# Patient Record
Sex: Male | Born: 1980 | Race: White | Hispanic: No | Marital: Married | Smoking: Never smoker
Health system: Southern US, Community
[De-identification: ages and names within clinical notes are randomized; demographics above are authoritative.]

## PROBLEM LIST (undated history)

## (undated) DIAGNOSIS — Z789 Other specified health status: Secondary | ICD-10-CM

## (undated) HISTORY — PX: NO PAST SURGERIES: SHX2092

---

## 2011-10-23 ENCOUNTER — Ambulatory Visit (INDEPENDENT_AMBULATORY_CARE_PROVIDER_SITE_OTHER): Payer: 59 | Admitting: Family Medicine

## 2011-10-23 VITALS — BP 143/80 | HR 42 | Temp 97.6°F | Resp 16 | Ht 79.5 in | Wt 186.0 lb

## 2011-10-23 DIAGNOSIS — H9209 Otalgia, unspecified ear: Secondary | ICD-10-CM

## 2011-10-23 DIAGNOSIS — L089 Local infection of the skin and subcutaneous tissue, unspecified: Secondary | ICD-10-CM

## 2011-10-23 DIAGNOSIS — L723 Sebaceous cyst: Secondary | ICD-10-CM

## 2011-10-23 MED ORDER — CEPHALEXIN 500 MG PO CAPS: 1000.0000 mg | ORAL_CAPSULE | Freq: Two times a day (BID) | ORAL | Status: AC

## 2011-10-23 NOTE — Progress Notes (Signed)
  Patient Name: Eric Mack Date of Birth: Jul 09, 1981 Medical Record Number: [id number] Gender: male Date of Encounter: 10/23/2011  History of Present Illness:  Eric Mack is a 31 y.o. very pleasant male patient who presents with the following:  About a week ago noted a "solid lump" at the base of his earlobe.  Came up suddenly over the course of a day- the size of a small grape, very hard.  It was not sore then, but yesterday started to hurt and turned red.  No otalygia, no hearing loss or ear drainage.   Otherwise he feel fine.  No fever or other symptoms.  Generally healthy and no other issues in the past.  Never had this before.   Of note the patient is quite athletic/ runs a lot and states that a low pulse is normal for him, he has been told that his pulse was low in the past.   There is no problem list on file for this patient.  History reviewed. No pertinent past medical history. History reviewed. No pertinent past surgical history. History  Substance Use Topics  . Smoking status: Never Smoker   . Smokeless tobacco: Never Used  . Alcohol Use: Not on file   History reviewed. No pertinent family history. Allergies no known allergies  Medication list has been reviewed and updated.  Review of Systems: As per HPI, otherwise negative  Physical Examination: Filed Vitals:   10/23/11 1835  BP: 143/80  Pulse: 42  Temp: 97.6 F (36.4 C)  TempSrc: Oral  Resp: 16  Height: 6' 7.5" (2.019 m)  Weight: 186 lb (84.369 kg)    Body mass index is 20.69 kg/(m^2).   GEN: WDWN, NAD, Non-toxic, Alert & Oriented x 3 HEENT: Atraumatic, Normocephalic.  CV: RRR, slightly bradycardic.  Pulm: CTA Bilaterally Ears and Nose: No external deformity.  Below right ear lobe (at connection between lobe and angle of jaw) there is a slightly tender, fluctuant, well circumscribed swelling typical of a cyst or abscess.  TM wnl bilaterally, oropharynx wnl.  No LAD EXTR: No  clubbing/cyanosis/edema NEURO: Normal gait.  PSYCH: Normally interactive. Conversant. Not depressed or anxious appearing.  Calm demeanor.   Procedure:  Cleaned area with alcohol.  Anesthesia with a small wheal of lidocaine 1%.  I and D with an 18 gauge needle- small amount of material expressed but needed to expand would slightly with an 11 blade.  Large amount of sebaceous material expressed.  Took a would culture but did seem to be just sebaceous material.  Expressed all sebaceous material.  Did not pack due to wound being so small.   Assessment and Plan: 1. Infected sebaceous cyst of skin  cephALEXin (KEFLEX) 500 MG capsule, Wound culture  infected sebaceous cyst s/p I&D.  Did not pack due to very small wound and likely sebaceous nature of cyst.  Will treat with Keflex as above.  Asked patient to apply warm compresses twice a day starting tomorrow, and keep covered with a band- aid until no longer oozing.  If any sign of worsening or if area returns please call or come back in.  Sooner if worse.

## 2011-10-26 ENCOUNTER — Encounter: Payer: Self-pay | Admitting: Family Medicine

## 2011-10-26 LAB — WOUND CULTURE
Gram Stain: NONE SEEN
Gram Stain: NONE SEEN

## 2012-02-26 ENCOUNTER — Ambulatory Visit: Payer: 59

## 2012-02-26 ENCOUNTER — Ambulatory Visit: Payer: 59 | Admitting: Emergency Medicine

## 2012-02-26 VITALS — BP 134/85 | HR 56 | Temp 98.3°F | Resp 14 | Ht 72.5 in | Wt 183.0 lb

## 2012-02-26 DIAGNOSIS — R05 Cough: Secondary | ICD-10-CM

## 2012-02-26 DIAGNOSIS — H109 Unspecified conjunctivitis: Secondary | ICD-10-CM

## 2012-02-26 DIAGNOSIS — J189 Pneumonia, unspecified organism: Secondary | ICD-10-CM

## 2012-02-26 LAB — POCT CBC
Granulocyte percent: 71.9 %G (ref 37–80)
HCT, POC: 47.7 % (ref 43.5–53.7)
Hemoglobin: 15.4 g/dL (ref 14.1–18.1)
POC Granulocyte: 7.4 — AB (ref 2–6.9)

## 2012-02-26 MED ORDER — OFLOXACIN 0.3 % OP SOLN: OPHTHALMIC | Status: DC

## 2012-02-26 MED ORDER — AZITHROMYCIN 250 MG PO TABS: ORAL_TABLET | ORAL | Status: AC

## 2012-02-26 NOTE — Progress Notes (Signed)
  Subjective:    Patient ID: Eric Mack, male    DOB: 1981-05-30, 31 y.o.   MRN: [id number]  HPI patient enters with onset on Saturday evening of fever chills myalgias. This was followed by head and chest congestion. He has had a persistent cough recently the cough has changed and he has develop any discolored phlegm his fever has subsided however yesterday started having crusting and discomfort involving the right. He's had no nausea vomiting or other systemic symptoms    Review of Systems  Constitutional: Positive for fever and fatigue.  HENT: Positive for congestion and rhinorrhea.   Eyes: Positive for discharge.       He has a crusty discharge around the right. eye  Respiratory: Positive for cough.   Cardiovascular: Negative for chest pain, palpitations and leg swelling.       Objective:   Physical Exam  Constitutional: He appears well-developed and well-nourished.  HENT:  Head: Normocephalic.       Ears are wax occluded  Pulmonary/Chest: Effort normal and breath sounds normal. No respiratory distress. He has no wheezes. He has no rales.  Abdominal: Soft. Bowel sounds are normal. He exhibits no distension. There is no tenderness. There is no rebound.   Results for orders placed in visit on 02/26/12  POCT CBC      Component Value Range   WBC 10.3 (*) 4.6 - 10.2 K/uL   Lymph, poc 2.3  0.6 - 3.4   POC LYMPH PERCENT 22.2  10 - 50 %L   MID (cbc) 0.6  0 - 0.9   POC MID % 5.9  0 - 12 %M   POC Granulocyte 7.4 (*) 2 - 6.9   Granulocyte percent 71.9  37 - 80 %G   RBC 4.97  4.69 - 6.13 M/uL   Hemoglobin 15.4  14.1 - 18.1 g/dL   HCT, POC 41.3  24.4 - 53.7 %   MCV 95.9  80 - 97 fL   MCH, POC 31.0  27 - 31.2 pg   MCHC 32.3  31.8 - 35.4 g/dL   RDW, POC 01.0     Platelet Count, POC 248  142 - 424 K/uL   MPV 10.5  0 - 99.8 fL   [facility] reading (PRIMARY) by  Dr.Mykale Gandolfo for increased markings in the right middle lobe .       Assessment & Plan:

## 2012-02-26 NOTE — Patient Instructions (Addendum)
Take medications as prescribed. Recheck 48-72 hours if not improvedConjunctivitis Conjunctivitis is commonly called "pink eye." Conjunctivitis can be caused by bacterial or viral infection, allergies, or injuries. There is usually redness of the lining of the eye, itching, discomfort, and sometimes discharge. There may be deposits of matter along the eyelids. A viral infection usually causes a watery discharge, while a bacterial infection causes a yellowish, thick discharge. Pink eye is very contagious and spreads by direct contact. You may be given antibiotic eyedrops as part of your treatment. Before using your eye medicine, remove all drainage from the eye by washing gently with warm water and cotton balls. Continue to use the medication until you have awakened 2 mornings in a row without discharge from the eye. Do not rub your eye. This increases the irritation and helps spread infection. Use separate towels from other household members. Wash your hands with soap and water before and after touching your eyes. Use cold compresses to reduce pain and sunglasses to relieve irritation from light. Do not wear contact lenses or wear eye makeup until the infection is gone. SEEK MEDICAL CARE IF:   Your symptoms are not better after 3 days of treatment.   You have increased pain or trouble seeing.   The outer eyelids become very red or swollen.  Document Released: 10/01/2004 Document Revised: 08/13/2011 Document Reviewed: 08/24/2005 [facility] Patient Information 2012 [place], [place].Pneumonia, Adult Pneumonia is an infection of the lungs.  CAUSES Pneumonia may be caused by bacteria or a virus. Usually, these infections are caused by breathing infectious particles into the lungs (respiratory tract). SYMPTOMS   Cough.   Fever.   Chest pain.   Increased rate of breathing.   Wheezing.   Mucus production.  DIAGNOSIS  If you have the common symptoms of pneumonia, your caregiver will typically confirm  the diagnosis with a chest X-ray. The X-ray will show an abnormality in the lung (pulmonary infiltrate) if you have pneumonia. Other tests of your blood, urine, or sputum may be done to find the specific cause of your pneumonia. Your caregiver may also do tests (blood gases or pulse oximetry) to see how well your lungs are working. TREATMENT  Some forms of pneumonia may be spread to other people when you cough or sneeze. You may be asked to wear a mask before and during your exam. Pneumonia that is caused by bacteria is treated with antibiotic medicine. Pneumonia that is caused by the influenza virus may be treated with an antiviral medicine. Most other viral infections must run their course. These infections will not respond to antibiotics.  PREVENTION A pneumococcal shot (vaccine) is available to prevent a common bacterial cause of pneumonia. This is usually suggested for:  People over 41 years old.   Patients on chemotherapy.   People with chronic lung problems, such as bronchitis or emphysema.   People with immune system problems.  If you are over 65 or have a high risk condition, you may receive the pneumococcal vaccine if you have not received it before. In some countries, a routine influenza vaccine is also recommended. This vaccine can help prevent some cases of pneumonia.You may be offered the influenza vaccine as part of your care. If you smoke, it is time to quit. You may receive instructions on how to stop smoking. Your caregiver can provide medicines and counseling to help you quit. HOME CARE INSTRUCTIONS   Cough suppressants may be used if you are losing too much rest. However, coughing protects you by  clearing your lungs. You should avoid using cough suppressants if you can.   Your caregiver may have prescribed medicine if he or she thinks your pneumonia is caused by a bacteria or influenza. Finish your medicine even if you start to feel better.   Your caregiver may also  prescribe an expectorant. This loosens the mucus to be coughed up.   Only take over-the-counter or prescription medicines for pain, discomfort, or fever as directed by your caregiver.   Do not smoke. Smoking is a common cause of bronchitis and can contribute to pneumonia. If you are a smoker and continue to smoke, your cough may last several weeks after your pneumonia has cleared.   A cold steam vaporizer or humidifier in your room or home may help loosen mucus.   Coughing is often worse at night. Sleeping in a semi-upright position in a recliner or using a couple pillows under your head will help with this.   Get rest as you feel it is needed. Your body will usually let you know when you need to rest.  SEEK IMMEDIATE MEDICAL CARE IF:   Your illness becomes worse. This is especially true if you are elderly or weakened from any other disease.   You cannot control your cough with suppressants and are losing sleep.   You begin coughing up blood.   You develop pain which is getting worse or is uncontrolled with medicines.   You have a fever.   Any of the symptoms which initially brought you in for treatment are getting worse rather than better.   You develop shortness of breath or chest pain.  MAKE SURE YOU:   Understand these instructions.   Will watch your condition.   Will get help right away if you are not doing well or get worse.  Document Released: 08/24/2005 Document Revised: 08/13/2011 Document Reviewed: 11/13/2010 [facility] Patient Information 2012 [place], [place].

## 2013-05-24 ENCOUNTER — Ambulatory Visit (INDEPENDENT_AMBULATORY_CARE_PROVIDER_SITE_OTHER): Payer: 59 | Admitting: Family Medicine

## 2013-05-24 ENCOUNTER — Ambulatory Visit: Payer: 59

## 2013-05-24 VITALS — BP 120/78 | HR 66 | Temp 98.6°F | Resp 18 | Ht 71.0 in | Wt 189.2 lb

## 2013-05-24 DIAGNOSIS — M542 Cervicalgia: Secondary | ICD-10-CM

## 2013-05-24 DIAGNOSIS — S139XXA Sprain of joints and ligaments of unspecified parts of neck, initial encounter: Secondary | ICD-10-CM

## 2013-05-24 MED ORDER — IBUPROFEN 800 MG PO TABS: 800.0000 mg | ORAL_TABLET | Freq: Three times a day (TID) | ORAL | Status: DC | PRN

## 2013-05-24 MED ORDER — CYCLOBENZAPRINE HCL 5 MG PO TABS: 5.0000 mg | ORAL_TABLET | Freq: Three times a day (TID) | ORAL | Status: DC | PRN

## 2013-05-24 MED ORDER — TRAMADOL HCL 50 MG PO TABS: 50.0000 mg | ORAL_TABLET | Freq: Four times a day (QID) | ORAL | Status: DC | PRN

## 2013-05-24 NOTE — Progress Notes (Signed)
[address]   [place], [place]  [address]   [id number]  Subjective:    Patient ID: Eric Mack, male    DOB: 24-Apr-1981, 32 y.o.   MRN: [id number]  Neck Pain  Associated symptoms include weakness. Pertinent negatives include no fever, headaches, numbness or photophobia.   This 32 y.o. male presents for evaluation of neck pain.  No injury; onset four days ago.  Awoke with neck pain.  With extension of neck, has severe neck pain.  Similar symptoms in May 2014, with weight lifting process.  Started reading in bed in prolonged flexion in May 2014.  Have restarted reading in bed with recurrent pain.  No previous evaluation.  Pain quite severe.  Shoots down arm with extension.  No n/t in arm; shooting pain in R arm intermittent.  Severity 8/10.  ASA six today unknown dose.  +pain interfering with sleep.  No weight lifting.  +ice to area; tried heat last night; no improvement with ice; heat helped last night.  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  PCP: none  Review of Systems  Constitutional: Negative for fever, chills, diaphoresis and fatigue.  HENT: Positive for neck pain.   Eyes: Negative for photophobia and visual disturbance.  Musculoskeletal: Positive for myalgias and back pain.  Neurological: Positive for weakness. Negative for numbness and headaches.    History reviewed. No pertinent past medical history.  History reviewed. No pertinent past surgical history.  Prior to Admission medications   Medication Sig Start Date End Date Taking? Authorizing Provider  ofloxacin (OCUFLOX) 0.3 % ophthalmic solution S1-2 drops in the right eye every 3-4 hours 02/26/12   Collene Gobble, MD    No Known Allergies  History   Social History  . Marital Status: Unknown    Spouse Name: N/A    Number of Children: N/A  . Years of Education: N/A   Occupational History  . Not on file.   Social History Main Topics  . Smoking status: Never  Smoker   . Smokeless tobacco: Never Used  . Alcohol Use: Yes  . Drug Use: No  . Sexual Activity: Not on file   Other Topics Concern  . Not on file   Social History Narrative  . No narrative on file    History reviewed. No pertinent family history.     Objective:   Physical Exam  Nursing note and vitals reviewed. Constitutional: He is oriented to person, place, and time. He appears well-developed and well-nourished. No distress.  HENT:  Head: Normocephalic and atraumatic.  Eyes: Conjunctivae and EOM are normal. Pupils are equal, round, and reactive to light.  Neck: Normal range of motion. Neck supple. No thyromegaly present.  Cardiovascular: Normal rate, regular rhythm and normal heart sounds.  Exam reveals no gallop and no friction rub.   No murmur heard. Pulmonary/Chest: Effort normal and breath sounds normal.  Musculoskeletal:       Right shoulder: Normal.       Left shoulder: Normal.       Cervical back: He exhibits decreased range of motion, tenderness, pain and spasm. He exhibits no bony tenderness and normal pulse.  Cervical spine:  TTP R paraspinal region; pain with extension, lateral bending, rotating side to side.  Full ROM BUE: motor 5/5 BUE.  Grip 5/5.  Lymphadenopathy:    He has no cervical adenopathy.  Neurological: He is alert and oriented to person, place, and time. He  has normal reflexes. No cranial nerve deficit. He exhibits normal muscle tone. Coordination normal.  Skin: Skin is warm and dry. No rash noted. He is not diaphoretic.  Psychiatric: He has a normal mood and affect. His behavior is normal. Judgment and thought content normal.   [facility] reading (PRIMARY) by  Dr. Katrinka Blazing. CERVICAL SPINE: NAD.      Assessment & Plan:  Neck pain - Plan: DG Cervical Spine Complete  Neck sprain and strain, initial encounter  1. Cervical neck pain/strain:  New.  Rx for Ibuprofen 800mg  tid, Flexeril 5mg  one po tid PRN, Tramadol 50mg  PRN pain.  Home exercises reviewed and  advised to perform daily; recommend heat to area for 15 minutes bid for two weeks. If no improvement in 10-14 days, call for ortho referral. Avoid heavy lifting or overhead work for two weeks.   Meds ordered this encounter  Medications  . ibuprofen (ADVIL,MOTRIN) 800 MG tablet    Sig: Take 1 tablet (800 mg total) by mouth every 8 (eight) hours as needed for pain.    Dispense:  30 tablet    Refill:  0  . cyclobenzaprine (FLEXERIL) 5 MG tablet    Sig: Take 1 tablet (5 mg total) by mouth 3 (three) times daily as needed for muscle spasms.    Dispense:  30 tablet    Refill:  1  . traMADol (ULTRAM) 50 MG tablet    Sig: Take 1-2 tablets (50-100 mg total) by mouth every 6 (six) hours as needed for pain.    Dispense:  40 tablet    Refill:  0

## 2013-10-29 ENCOUNTER — Ambulatory Visit: Payer: 59 | Admitting: Emergency Medicine

## 2013-10-29 VITALS — BP 110/70 | HR 60 | Temp 97.9°F | Resp 16 | Ht 71.0 in | Wt 192.0 lb

## 2013-10-29 DIAGNOSIS — H9201 Otalgia, right ear: Secondary | ICD-10-CM

## 2013-10-29 DIAGNOSIS — L723 Sebaceous cyst: Secondary | ICD-10-CM

## 2013-10-29 DIAGNOSIS — H9209 Otalgia, unspecified ear: Secondary | ICD-10-CM

## 2013-10-29 DIAGNOSIS — H938X9 Other specified disorders of ear, unspecified ear: Secondary | ICD-10-CM

## 2013-10-29 NOTE — Patient Instructions (Signed)
WOUND CARE Please return in 6-8 days to have your stitches/staples removed or sooner if you have concerns. Marland Kitchen. Keep area clean and dry for 24 hours. Do not remove bandage, if applied. . After 24 hours, remove bandage and wash wound gently with mild soap and warm water. Reapply a new bandage after cleaning wound, if directed. . Continue daily cleansing with soap and water until stitches/staples are removed. . Do not apply any ointments or creams to the wound while stitches/staples are in place, as this may cause delayed healing. . Notify the office if you experience any of the following signs of infection: Swelling, redness, pus drainage, streaking, fever >101.0 F . Notify the office if you experience excessive bleeding that does not stop after 15-20 minutes of constant, firm pressure.

## 2013-10-29 NOTE — Progress Notes (Signed)
  [facility] [address], [place] [address] [phone]  Date:  10/29/2013   Name:  Eric AmesMatthew L Mack   DOB:  [birth year]   MRN:  [id number]  PCP:  No PCP Per Patient    Chief Complaint: Cyst   History of Present Illness:  Eric Mack is a 33 y.o. 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  Previously drained a year ago.  Now having pain when uses telephone or wears a headphone at work.  No history of injury.  No drainage.  No improvement with over the counter medications or other home remedies. Denies other complaint or health concern today.   There are no active problems to display for this patient.   History reviewed. No pertinent past medical history.  History reviewed. No pertinent past surgical history.  History  Substance Use Topics  . Smoking status: Never Smoker   . Smokeless tobacco: Never Used  . Alcohol Use: Yes    History reviewed. No pertinent family history.  No Known Allergies  Medication list has been reviewed and updated.  Current Outpatient Prescriptions on File Prior to Visit  Medication Sig Dispense Refill  . cyclobenzaprine (FLEXERIL) 5 MG tablet Take 1 tablet (5 mg total) by mouth 3 (three) times daily as needed for muscle spasms.  30 tablet  1  . traMADol (ULTRAM) 50 MG tablet Take 1-2 tablets (50-100 mg total) by mouth every 6 (six) hours as needed for pain.  40 tablet  0   No current facility-administered medications on file prior to visit.    Review of Systems:  As per HPI, otherwise negative.    Physical Examination: Filed Vitals:   10/29/13 1205  BP: 110/70  Pulse: 60  Temp: 97.9 F (36.6 C)  Resp: 16   Filed Vitals:   10/29/13 1205  Height: 5\' 11"  (1.803 m)  Weight: 192 lb (87.091 kg)   Body mass index is 26.79 kg/(m^2). Ideal Body Weight: Weight in (lb) to have BMI = 25: 178.9   GEN: WDWN,  NAD, Non-toxic, Alert & Oriented x 3 HEENT: Atraumatic, Normocephalic.  Ears and Nose: No external deformity. EXTR: No clubbing/cyanosis/edema NEURO: Normal gait.  PSYCH: Normally interactive. Conversant. Not depressed or anxious appearing.  Calm demeanor.  Right ear:  Fullness that is mobile under the skin and is not fixed.  1 cm diameter in superior earlobe.  Assessment and Plan: Sebaceous cyst Ear pain  Signed,  Phillips OdorJeffery Anderson, MD

## 2013-10-29 NOTE — Progress Notes (Signed)
Verbal Consent obtained. Local anesthesia with a total of 7 cc 2% lidocaine plain. Sterile prep and drape. Incision with 15 blade posteriorly. Very difficult dissection of sebaceous cyst en toto. Closed with 5-0 Ethilon sutures, #2 SI, #1 HM. Cleansed and dressed.

## 2013-11-04 ENCOUNTER — Ambulatory Visit (INDEPENDENT_AMBULATORY_CARE_PROVIDER_SITE_OTHER): Payer: 59 | Admitting: Physician Assistant

## 2013-11-04 VITALS — BP 110/70 | HR 60 | Temp 98.0°F | Resp 16 | Ht 71.0 in | Wt 192.0 lb

## 2013-11-04 DIAGNOSIS — L723 Sebaceous cyst: Secondary | ICD-10-CM

## 2013-11-04 NOTE — Progress Notes (Signed)
   Subjective:    Patient ID: Francina AmesMatthew L Novell, male    DOB: Feb 05, 1981, 33 y.o.   MRN: [id number]  HPI Pt presents to clinic for suture removal.  The area is slightly tender behind his Right ear where the sebaceous cyst was removed.  He has not had any problems with the sutures.  Review of Systems     Objective:   Physical Exam  Vitals reviewed. Constitutional: He appears well-developed and well-nourished.  HENT:  Head: Normocephalic and atraumatic.  Right Ear: External ear normal.  Left Ear: External ear normal.  Pulmonary/Chest: Effort normal.  Skin: Skin is warm and dry.  #3 stitches removed - in the middle of wound after the stitches were removed 4 mm dehisced area - no signs of infection was present - steri-strips placed to help with healing  Psychiatric: He has a normal mood and affect. His behavior is normal. Judgment and thought content normal.      Assessment & Plan:  Sebaceous cyst Healing wound behind the right ear - wound care d/w patient  Benny LennertSarah Weber [facility] 11/04/2013 10:22 AM

## 2014-09-11 ENCOUNTER — Ambulatory Visit (INDEPENDENT_AMBULATORY_CARE_PROVIDER_SITE_OTHER): Payer: 59 | Admitting: Emergency Medicine

## 2014-09-11 VITALS — BP 120/78 | HR 42 | Temp 97.8°F | Resp 16 | Ht 71.0 in | Wt 189.5 lb

## 2014-09-11 DIAGNOSIS — L03211 Cellulitis of face: Secondary | ICD-10-CM

## 2014-09-11 DIAGNOSIS — L0201 Cutaneous abscess of face: Secondary | ICD-10-CM

## 2014-09-11 MED ORDER — DOXYCYCLINE HYCLATE 100 MG PO CAPS: 100.0000 mg | ORAL_CAPSULE | Freq: Two times a day (BID) | ORAL | Status: DC

## 2014-09-11 NOTE — Progress Notes (Signed)
[facility] [address], [place] [address] [phone]  Date:  09/11/2014   Name:  Eric AmesMatthew L Hamil   DOB:  [birth year]   MRN:  [id number]  PCP:  No PCP Per Patient    Chief Complaint: Skin Irritation   History of Present Illness:  Eric AmesMatthew L Base is a 34 y.o. very pleasant male patient who presents with the following:  Has a 3 cm pustular scarred area on the right jaw line Thinks he may have cut himself.  Has been present for weeks No fever or chills.  No drainage No improvement with over the counter medications or other home remedies.  Denies other complaint or health concern today.   There are no active problems to display for this patient.   History reviewed. No pertinent past medical history.  History reviewed. No pertinent past surgical history.  History  Substance Use Topics  . Smoking status: Never Smoker   . Smokeless tobacco: Never Used  . Alcohol Use: Yes    History reviewed. No pertinent family history.  No Known Allergies  Medication list has been reviewed and updated.  No current outpatient prescriptions on file prior to visit.   No current facility-administered medications on file prior to visit.    Review of Systems:  As per HPI, otherwise negative.    Physical Examination: Filed Vitals:   09/11/14 1948  BP: 120/78  Pulse: 42  Temp: 97.8 F (36.6 C)  Resp: 16   Filed Vitals:   09/11/14 1948  Height: 5\' 11"  (1.803 m)  Weight: 189 lb 8 oz (85.957 kg)   Body mass index is 26.44 kg/(m^2). Ideal Body Weight: Weight in (lb) to have BMI = 25: 178.9   GEN: WDWN, NAD, Non-toxic, Alert & Oriented x 3 HEENT: Atraumatic, Normocephalic.  Ears and Nose: No external deformity. EXTR: No clubbing/cyanosis/edema NEURO: Normal gait.  PSYCH: Normally interactive. Conversant. Not depressed or anxious appearing.  Calm demeanor.  Left cheek:  Tender fluctuant mass at jawline  Assessment and Plan: Pustule neck Aspiration   Culture Doxy  Signed,  Phillips OdorJeffery [place]ile, MD

## 2014-09-11 NOTE — Patient Instructions (Signed)

## 2014-09-15 LAB — WOUND CULTURE
Gram Stain: NONE SEEN
Gram Stain: NONE SEEN

## 2014-10-16 ENCOUNTER — Encounter: Payer: Self-pay | Admitting: Emergency Medicine

## 2014-10-16 ENCOUNTER — Ambulatory Visit (INDEPENDENT_AMBULATORY_CARE_PROVIDER_SITE_OTHER): Payer: 59 | Admitting: Family Medicine

## 2014-10-16 VITALS — BP 124/80 | HR 45 | Temp 97.8°F | Resp 16 | Ht 71.0 in | Wt 190.0 lb

## 2014-10-16 DIAGNOSIS — L0201 Cutaneous abscess of face: Secondary | ICD-10-CM

## 2014-10-16 MED ORDER — AMOXICILLIN-POT CLAVULANATE 875-125 MG PO TABS: 1.0000 | ORAL_TABLET | Freq: Two times a day (BID) | ORAL | Status: DC

## 2014-10-16 NOTE — Patient Instructions (Signed)
Please apply hot compresses to the affected area at least three times a day. If not significantly better by Sunday, let us know

## 2014-10-16 NOTE — Progress Notes (Signed)
° °  Subjective:  This chart was scribed for Elvina SidleKurt Lauenstein, MD by Evon Slackerrance Branch, ED Scribe. This Patient was seen in room 12 and the patients care was started at 7:21 PM   Patient ID: Eric Mack, male    DOB: 1980/11/10, 34 y.o.   MRN: [id number]  Chief Complaint  Patient presents with   Wound Check    right side of face    HPI HPI Comments: Eric AmesMatthew L Hardigree is a 34 y.o. male who presents to the [facility] complaining of improving infection on the ride side of face onset 2 months prior. Pt states that he has had some associated drainage. Pt states that the wound is periodically painful. Pt states that he has tried antibiotics that provided slight relief. Pt states that he has also tried warm compresses with no relief. Pt doesn't report any other symptoms.    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  Review of Systems  Constitutional: Negative for fever.  Skin: Positive for wound (skin infection ).   No cough, abdominal symptoms, rash other than the persisting right mandibular skin infection  Objective:   BP 124/80 mmHg   Pulse 45   Temp(Src) 97.8 F (36.6 C) (Oral)   Resp 16   Ht 5\' 11"  (1.803 m)   Wt 190 lb (86.183 kg)   BMI 26.51 kg/m2   SpO2 98%    Physical Exam  Constitutional: He is oriented to person, place, and time. He appears well-developed and well-nourished. No distress.  HENT:  Head: Normocephalic and atraumatic.  Eyes: Conjunctivae and EOM are normal.  Neck: Neck supple. No tracheal deviation present.  Cardiovascular: Normal rate.   Pulmonary/Chest: Effort normal. No respiratory distress.  Musculoskeletal: Normal range of motion.  Neurological: He is alert and oriented to person, place, and time.  Skin: Skin is warm and dry.  1 cm x 1.5 cm right mandibular raised intradermal abscess.    Psychiatric: He has a normal mood and affect. His behavior is normal.  Nursing note and vitals reviewed.    Assessment & Plan:    I personally performed  the services described in this documentation, which was scribed in my presence. The recorded information has been reviewed and is accurate.  This chart was scribed in my presence and reviewed by me personally.    ICD-9-CM ICD-10-CM   1. Facial abscess 682.0 L02.01 amoxicillin-clavulanate (AUGMENTIN) 875-125 MG per tablet     Signed, Elvina SidleKurt Lauenstein, MD

## 2015-06-08 ENCOUNTER — Encounter (HOSPITAL_COMMUNITY): Payer: Self-pay

## 2015-06-08 ENCOUNTER — Emergency Department (HOSPITAL_COMMUNITY)
Admission: EM | Admit: 2015-06-08 | Discharge: 2015-06-08 | Disposition: A | Discharge status: Home / Self Care | Payer: 59 | Attending: Emergency Medicine | Admitting: Emergency Medicine

## 2015-06-08 DIAGNOSIS — Z792 Long term (current) use of antibiotics: Secondary | ICD-10-CM | POA: Diagnosis not present

## 2015-06-08 DIAGNOSIS — Y9389 Activity, other specified: Secondary | ICD-10-CM | POA: Insufficient documentation

## 2015-06-08 DIAGNOSIS — Y929 Unspecified place or not applicable: Secondary | ICD-10-CM | POA: Diagnosis not present

## 2015-06-08 DIAGNOSIS — W260XXA Contact with knife, initial encounter: Secondary | ICD-10-CM | POA: Insufficient documentation

## 2015-06-08 DIAGNOSIS — Y999 Unspecified external cause status: Secondary | ICD-10-CM | POA: Diagnosis not present

## 2015-06-08 DIAGNOSIS — S61012A Laceration without foreign body of left thumb without damage to nail, initial encounter: Secondary | ICD-10-CM | POA: Insufficient documentation

## 2015-06-08 DIAGNOSIS — S6992XA Unspecified injury of left wrist, hand and finger(s), initial encounter: Secondary | ICD-10-CM | POA: Diagnosis present

## 2015-06-08 MED ORDER — LIDOCAINE HCL (PF) 1 % IJ SOLN
5.0000 mL | Freq: Once | INTRAMUSCULAR | Status: AC
  Administered 2015-06-08: 5 mL
  Filled 2015-06-08: qty 5

## 2015-06-08 MED ORDER — SODIUM BICARBONATE 4 % IV SOLN
5.0000 mL | Freq: Once | INTRAVENOUS | Status: AC
  Administered 2015-06-08: 5 mL via SUBCUTANEOUS
  Filled 2015-06-08: qty 5

## 2015-06-08 NOTE — Discharge Instructions (Signed)

## 2015-06-08 NOTE — ED Notes (Signed)
Reviewed d/c instructions and follow-up care with pt. Pt verbalized understanding 

## 2015-06-08 NOTE — ED Notes (Signed)
Pt reports lacerating left thumb this morning.  There is an approximately 1/2" flap on the knuckle of the left thumb.

## 2015-06-08 NOTE — ED Provider Notes (Signed)
CSN: [id number]     Arrival date & time 06/08/15  7829 History   First MD Initiated Contact with Patient 06/08/15 0700     Chief Complaint  Patient presents with  . Extremity Laceration    left thumb     (Consider location/radiation/quality/duration/timing/severity/associated sxs/prior Treatment) The history is provided by the patient.   patient presents after a laceration to his left thumb. States he was sharpening a knife this morning and slipped and hit him on the hand. States it was a fair amount of bleeding. No other injury. States tetanus is up-to-date. No numbness or weakness.  No past medical history on file. No past surgical history on file. History reviewed. No pertinent family history. Social History  Substance Use Topics  . Smoking status: Never Smoker   . Smokeless tobacco: Never Used  . Alcohol Use: Yes    Review of Systems  Skin: Positive for wound.  Neurological: Negative for weakness and numbness.  Hematological: Does not bruise/bleed easily.      Allergies  Review of patient's allergies indicates no known allergies.  Home Medications   Prior to Admission medications   Medication Sig Start Date End Date Taking? Authorizing Provider  amoxicillin-clavulanate (AUGMENTIN) 875-125 MG per tablet Take 1 tablet by mouth 2 (two) times daily. 10/16/14   Eric Sidle, MD   BP 122/80 mmHg  Pulse 60  Temp(Src) 98.1 F (36.7 C) (Oral)  Resp 16  Ht  (1.803 m)  Wt 185 lb (83.915 kg)  BMI 25.81 kg/m2  SpO2 100% Physical Exam  Constitutional: He appears well-developed.  Musculoskeletal:  Approximately 2 cm laceration horizontally across the IP joint of the left thumb on the dorsal aspect. Good flexion and extension with full strength at the IP joint. Sensation intact distally. Good capillary refill distally.  Skin: Skin is warm.    ED Course  Procedures (including critical care time) Labs Review Labs Reviewed - No data to display  Imaging Review No  results found. I have personally reviewed and evaluated these images and lab results as part of my medical decision-making.   EKG Interpretation None      MDM   Final diagnoses:  Thumb laceration, left, initial encounter    Patient with laceration of thumb. Repaired. Did have apparent small laceration onto the cartilaginous surface on thumb. Not a clear open joint. Splinted and will have follow-up with hand surgery.  LACERATION REPAIR Performed by: Eric Mack Authorized by: Eric Mack Consent: Verbal consent obtained. Risks and benefits: risks, benefits and alternatives were discussed Consent given by: patient Patient identity confirmed: provided demographic data Prepped and Draped in normal sterile fashion Wound explored  Laceration Location: thumb   Laceration Length: 2cm  No Foreign Bodies seen or palpated  Anesthesia: digitial block  Local anesthetic: lidocaine 1%   Anesthetic total: 2 ml  Irrigation method: syringe Amount of cleaning: standard  Skin closure: 5-0 vicryl rapide  Number of sutures: 6  Technique: simple interupted  Patient tolerance: Patient tolerated the procedure well with no immediate complications.    Eric Core, MD 06/10/15 [phone]

## 2016-01-30 ENCOUNTER — Encounter (HOSPITAL_COMMUNITY): Payer: Self-pay | Admitting: Emergency Medicine

## 2016-01-30 ENCOUNTER — Emergency Department (HOSPITAL_COMMUNITY)
Admission: EM | Admit: 2016-01-30 | Discharge: 2016-01-30 | Disposition: A | Discharge status: Home / Self Care | Payer: 59 | Attending: Emergency Medicine | Admitting: Emergency Medicine

## 2016-01-30 DIAGNOSIS — Y998 Other external cause status: Secondary | ICD-10-CM | POA: Diagnosis not present

## 2016-01-30 DIAGNOSIS — S60459A Superficial foreign body of unspecified finger, initial encounter: Secondary | ICD-10-CM

## 2016-01-30 DIAGNOSIS — W458XXA Other foreign body or object entering through skin, initial encounter: Secondary | ICD-10-CM | POA: Insufficient documentation

## 2016-01-30 DIAGNOSIS — Y9389 Activity, other specified: Secondary | ICD-10-CM | POA: Diagnosis not present

## 2016-01-30 DIAGNOSIS — Z792 Long term (current) use of antibiotics: Secondary | ICD-10-CM | POA: Diagnosis not present

## 2016-01-30 DIAGNOSIS — Y9289 Other specified places as the place of occurrence of the external cause: Secondary | ICD-10-CM | POA: Insufficient documentation

## 2016-01-30 DIAGNOSIS — S60451A Superficial foreign body of left index finger, initial encounter: Secondary | ICD-10-CM | POA: Diagnosis not present

## 2016-01-30 MED ORDER — BACITRACIN ZINC 500 UNIT/GM EX OINT
TOPICAL_OINTMENT | Freq: Two times a day (BID) | CUTANEOUS | Status: DC
  Administered 2016-01-30: 1 via TOPICAL

## 2016-01-30 MED ORDER — LIDOCAINE HCL (PF) 1 % IJ SOLN
5.0000 mL | Freq: Once | INTRAMUSCULAR | Status: AC
  Administered 2016-01-30: 0.5 mL
  Filled 2016-01-30: qty 5

## 2016-01-30 NOTE — ED Provider Notes (Signed)
CSN: [id number]     Arrival date & time 01/30/16  1547 History  By signing my name below, I, Eric Mack, attest that this documentation has been prepared under the direction and in the presence of Eric BuffaloHope Mattheo Swindle, NP. Electronically Signed: Placido SouLogan Mack, ED Scribe. 01/30/2016. 4:27 PM.   Chief Complaint  Patient presents with  . Foreign Body in Skin   The history is provided by the patient. No language interpreter was used.    HPI Comments: Eric Mack is a 35 y.o. male who presents to the Emergency Department complaining of a splinter to the palmar aspect of his left index finger. He states that he was working in his attic around old wood which caused his splinter. Pt reports associated, mild, pain surrounding the wound that worsens with palpation. He states his most recent tetanus vaccination was last year. He denies any other associated symptoms at this time.   History reviewed. No pertinent past medical history. History reviewed. No pertinent past surgical history. History reviewed. No pertinent family history. Social History  Substance Use Topics  . Smoking status: Never Smoker   . Smokeless tobacco: Never Used  . Alcohol Use: Yes    Review of Systems  Skin: Positive for wound. Negative for color change.       Foreign body left index finger  All other systems reviewed and are negative.   Allergies  Review of patient's allergies indicates no known allergies.  Home Medications   Prior to Admission medications   Medication Sig Start Date End Date Taking? Authorizing Provider  amoxicillin-clavulanate (AUGMENTIN) 875-125 MG per tablet Take 1 tablet by mouth 2 (two) times daily. 10/16/14   Eric SidleKurt Lauenstein, MD   BP 126/80 mmHg  Pulse 58  Temp(Src) 97.6 F (36.4 C) (Oral)  Resp 18  SpO2 99%    Physical Exam  Constitutional: He is oriented to person, place, and time. He appears well-developed and well-nourished. No distress.  HENT:  Head: Normocephalic and atraumatic.   Eyes: EOM are normal.  Neck: Normal range of motion.  Cardiovascular: Normal rate.   Pulmonary/Chest: Effort normal. No respiratory distress.  Musculoskeletal: Normal range of motion.       Left hand: He exhibits tenderness.  Wood splinter under skin tip of left index finger.   Neurological: He is alert and oriented to person, place, and time.  Skin: Skin is warm and dry.  FB under skin  Psychiatric: He has a normal mood and affect. His behavior is normal.  Nursing note and vitals reviewed.   ED Course  .Foreign Body Removal Date/Time: 01/30/2016 4:49 PM Performed by: Eric Mack, Eric Mack Authorized by: Eric Mack, Eric Mack Consent: Verbal consent obtained. Risks and benefits: risks, benefits and alternatives were discussed Consent given by: patient Patient understanding: patient states understanding of the procedure being performed Required items: required blood products, implants, devices, and special equipment available Patient identity confirmed: verbally with patient Body area: skin General location: upper extremity Location details: left index finger Anesthesia: local infiltration Local anesthetic: lidocaine 1% without epinephrine Anesthetic total: 0.5 ml Patient sedated: no Patient restrained: no Patient cooperative: yes Removal mechanism: forceps Dressing: antibiotic ointment Tendon involvement: none Depth: subcutaneous Complexity: simple 1 objects recovered. Objects recovered: wood splinter Post-procedure assessment: foreign body removed Patient tolerance: Patient tolerated the procedure well with no immediate complications Comments: Up to date on tetanus    DIAGNOSTIC STUDIES: Oxygen Saturation is 99% on RA, normal by my interpretation.    COORDINATION OF CARE: 4:25 PM  Discussed next steps with pt. He verbalized understanding and is agreeable with the plan.    MDM  35 y.o. male with foreign body under the skin of the left index finger stable for d/c without erythema  or streaking noted, no fever or other signs of infection.   Final diagnoses:  Splinter of finger without major open wound or infection, initial encounter   I personally performed the services described in this documentation, which was scribed in my presence. The recorded information has been reviewed and is accurate.   Eric [employer], NP 01/30/16 1655  Leta Baptist, MD 02/02/16 [phone]

## 2016-01-30 NOTE — ED Notes (Signed)
Pt sts splinter in left index finger from attic wood today

## 2016-11-15 ENCOUNTER — Encounter (HOSPITAL_COMMUNITY): Payer: Self-pay | Admitting: Emergency Medicine

## 2016-11-15 ENCOUNTER — Ambulatory Visit (HOSPITAL_COMMUNITY)
Admission: EM | Admit: 2016-11-15 | Discharge: 2016-11-15 | Disposition: A | Discharge status: Home / Self Care | Payer: 59 | Attending: Internal Medicine | Admitting: Internal Medicine

## 2016-11-15 DIAGNOSIS — S0181XA Laceration without foreign body of other part of head, initial encounter: Secondary | ICD-10-CM

## 2016-11-15 NOTE — ED Triage Notes (Signed)
[facility] laceration to his face above his right eyebrow that occurred 3 days ago. The patient stated that his TDAP was current.

## 2016-11-15 NOTE — ED Provider Notes (Signed)
[facility]    CSN: [id number] Arrival date & time: 11/15/16  1534     History   Chief Complaint Chief Complaint  Patient presents with  . Facial Laceration    HPI Eric Mack is a 36 y.o. male.   Pt c/o laceration to his right eyebrow from 2 days ago suffered after walking into a door. No LOC.  Reports that it bleed significantly at the time but has not reopened.        History reviewed. No pertinent past medical history.  There are no active problems to display for this patient.   History reviewed. No pertinent surgical history.     Home Medications    Prior to Admission medications   Not on File    Family History History reviewed. No pertinent family history.  Social History Social History  Substance Use Topics  . Smoking status: Never Smoker  . Smokeless tobacco: Never Used  . Alcohol use Yes     Allergies   Patient has no known allergies.   Review of Systems Review of Systems  Constitutional: Positive for fever.  Gastrointestinal: Positive for diarrhea, nausea and vomiting.  Skin: Positive for wound.     Physical Exam Triage Vital Signs ED Triage Vitals  Enc Vitals Group     BP 11/15/16 1614 122/77     Pulse Rate 11/15/16 1614 114     Resp 11/15/16 1614 16     Temp 11/15/16 1614 98.4 F (36.9 C)     Temp Source 11/15/16 1614 Oral     SpO2 11/15/16 1614 97 %     Weight --      Height --      Head Circumference --      Peak Flow --      Pain Score 11/15/16 1613 0     Pain Loc --      Pain Edu? --      Excl. in [place]? --    No data found.   Updated Vital Signs BP 122/77 (BP Location: Right Arm)   Pulse 114   Temp 98.4 F (36.9 C) (Oral)   Resp 16   SpO2 97%   Visual Acuity Right Eye Distance:   Left Eye Distance:   Bilateral Distance:    Right Eye Near:   Left Eye Near:    Bilateral Near:     Physical Exam  HENT:  Head:    Skin:        UC Treatments / Results  Labs (all labs ordered are  listed, but only abnormal results are displayed) Labs Reviewed - No data to display  EKG  EKG Interpretation None       Radiology No results found.  Procedures .Marland Kitchen.Laceration Repair Date/Time: 11/15/2016 4:29 PM Performed by: Arnaldo NatalIAMOND, Meloni Hinz S Authorized by: Arnaldo NatalIAMOND, Jesus Nevills S   Consent:    Consent obtained:  Verbal   Consent given by:  Patient   Risks discussed:  Infection and pain   Alternatives discussed:  Delayed treatment Laceration details:    Location:  Face   Face location:  R eyebrow   Length (cm):  1 Repair type:    Repair type:  Simple Exploration:    Wound extent: areolar tissue violated     Contaminated: no   Treatment:    Area cleansed with:  Soap and water   Visualized foreign bodies/material removed: no   Skin repair:    Repair method:  Tissue adhesive Approximation:  Approximation:  Close Post-procedure details:    Dressing:  Antibiotic ointment and adhesive bandage   Patient tolerance of procedure:  Tolerated well, no immediate complications   (including critical care time)  Medications Ordered in UC Medications - No data to display   Initial Impression / Assessment and Plan / UC Course  I have reviewed the triage vital signs and the nursing notes.  Pertinent labs & imaging results that were available during my care of the patient were reviewed by me and considered in my medical decision making (see chart for details).     Natural skin flap as laceration on a bias. No infection.  Closed with Dermabond w/o complication  Final Clinical Impressions(s) / UC Diagnoses   Final diagnoses:  Facial laceration, initial encounter    New Prescriptions New Prescriptions   No medications on file     Arnaldo Natal, MD 11/15/16 [phone]

## 2018-09-23 ENCOUNTER — Encounter (HOSPITAL_COMMUNITY): Payer: Self-pay | Admitting: Emergency Medicine

## 2018-09-23 ENCOUNTER — Ambulatory Visit (HOSPITAL_COMMUNITY)
Admission: EM | Admit: 2018-09-23 | Discharge: 2018-09-23 | Disposition: A | Discharge status: Home / Self Care | Payer: 59 | Attending: Internal Medicine | Admitting: Internal Medicine

## 2018-09-23 ENCOUNTER — Other Ambulatory Visit: Payer: Self-pay

## 2018-09-23 DIAGNOSIS — R69 Illness, unspecified: Secondary | ICD-10-CM | POA: Diagnosis not present

## 2018-09-23 DIAGNOSIS — J111 Influenza due to unidentified influenza virus with other respiratory manifestations: Secondary | ICD-10-CM

## 2018-09-23 MED ORDER — BENZONATATE 200 MG PO CAPS: 200.0000 mg | ORAL_CAPSULE | Freq: Three times a day (TID) | ORAL | 0 refills | Status: DC | PRN

## 2018-09-23 NOTE — ED Triage Notes (Signed)
PT reports cough, body aches, flu symptoms. Daughter just tested positive for the flu this AM.

## 2018-09-23 NOTE — Discharge Instructions (Addendum)
Symptoms seem most consistent with the flu or a flu like virus.  Push fluids and rest.  Anticipate gradual improvement in cough and well being over the next several days.  Take ibuprofen or naproxen or tylenol as needed for fever, achiness/headache.  Prescription for benzonatate for cough was sent to the pharmacy.  Recheck for new fever >100.5, increasing phlegm production/nasal discharge, or if not starting to improve in a few days.

## 2018-09-23 NOTE — ED Provider Notes (Signed)
[facility]    CSN: [id number] Arrival date & time: 09/23/18  6754     History   Chief Complaint Chief Complaint  Patient presents with  . Appointment  . Influenza    HPI Eric Mack is a 38 y.o. male.   Presents today with 2 to 3-day history of headache, body aches, and bad cough.  Daughter has similar symptoms and was swabbed for the flu this morning, with positive results.  Not much runny/congested nose, not much sore throat.  No GI upset.  Feels terrible.    HPI  History reviewed. No pertinent past medical history.   History reviewed. No pertinent surgical history.     Home Medications    Prior to Admission medications   Medication Sig Start Date End Date Taking? Authorizing Provider  benzonatate (TESSALON) 200 MG capsule Take 1 capsule (200 mg total) by mouth 3 (three) times daily as needed for cough. 09/23/18   Isa Rankin, MD    Family History No family history on file.  Social History Social History   Tobacco Use  . Smoking status: Never Smoker  . Smokeless tobacco: Never Used  Substance Use Topics  . Alcohol use: Yes  . Drug use: No     Allergies   Patient has no known allergies.   Review of Systems Review of Systems  All other systems reviewed and are negative.    Physical Exam Triage Vital Signs ED Triage Vitals [09/23/18 1014]  Enc Vitals Group     BP      Pulse Rate 84     Resp 16     Temp 98.6 F (37 C)     Temp Source Oral     SpO2 100 %     Weight      Height      Pain Score 6     Pain Loc    Updated Vital Signs Pulse 84   Temp 98.6 F (37 C) (Oral)   Resp 16   SpO2 100%  Physical Exam Vitals signs and nursing note reviewed.  Constitutional:      Comments: Alert, nicely groomed Laying down on exam table but able to sit up for exam.  Looks ill but not toxic Voice is hoarse  HENT:     Head: Atraumatic.     Comments: Bilateral ears are waxy Minimal nasal congestion bilaterally Posterior  pharynx is slightly dry, slightly injected Eyes:     Comments: Conjugate gaze, no eye redness/drainage  Neck:     Musculoskeletal: Neck supple.  Cardiovascular:     Rate and Rhythm: Normal rate and regular rhythm.  Pulmonary:     Effort: No respiratory distress.     Breath sounds: No wheezing or rales.     Comments: Lungs clear, symmetric breath sounds  Abdominal:     General: There is no distension.  Musculoskeletal: Normal range of motion.  Skin:    General: Skin is warm and dry.     Comments: No cyanosis  Neurological:     Mental Status: He is alert and oriented to person, place, and time.       Final Clinical Impressions(s) / UC Diagnoses   Final diagnoses:  Influenza-like illness     Discharge Instructions     Symptoms seem most consistent with the flu or a flu like virus.  Push fluids and rest.  Anticipate gradual improvement in cough and well being over the next several days.  Take ibuprofen  or naproxen or tylenol as needed for fever, achiness/headache.  Prescription for benzonatate for cough was sent to the pharmacy.  Recheck for new fever >100.5, increasing phlegm production/nasal discharge, or if not starting to improve in a few days.      ED Prescriptions    Medication Sig Dispense Auth. Provider   benzonatate (TESSALON) 200 MG capsule Take 1 capsule (200 mg total) by mouth 3 (three) times daily as needed for cough. 30 capsule Isa Rankin, MD        Isa Rankin, MD 09/25/18 2004

## 2019-12-21 ENCOUNTER — Ambulatory Visit (HOSPITAL_COMMUNITY)
Admission: EM | Admit: 2019-12-21 | Discharge: 2019-12-21 | Disposition: A | Discharge status: Home / Self Care | Payer: 59 | Attending: Family Medicine | Admitting: Family Medicine

## 2019-12-21 ENCOUNTER — Encounter (HOSPITAL_COMMUNITY): Payer: Self-pay

## 2019-12-21 DIAGNOSIS — S61210A Laceration without foreign body of right index finger without damage to nail, initial encounter: Secondary | ICD-10-CM

## 2019-12-21 MED ORDER — TETANUS-DIPHTH-ACELL PERTUSSIS 5-2.5-18.5 LF-MCG/0.5 IM SUSP
INTRAMUSCULAR | Status: AC
  Filled 2019-12-21: qty 0.5

## 2019-12-21 MED ORDER — LIDOCAINE HCL (PF) 2 % IJ SOLN
INTRAMUSCULAR | Status: AC
  Filled 2019-12-21: qty 5

## 2019-12-21 MED ORDER — ACETAMINOPHEN 325 MG PO TABS
ORAL_TABLET | ORAL | Status: AC
  Filled 2019-12-21: qty 3

## 2019-12-21 NOTE — Discharge Instructions (Addendum)
Return in 7 days for suture removal. Continue to monitor for signs of infection. Change bandage if soiled.

## 2019-12-21 NOTE — ED Provider Notes (Signed)
[facility]    CSN: [id number] Arrival date & time: 12/21/19  1745      History   Chief Complaint Chief Complaint  Patient presents with  . Extremity Laceration    HPI Eric Mack is a 39 y.o. male.   HPI  Patient presents with a laceration to his right index finger.  Patient was cutting his son's hair and inadvertently lacerated the tip of his right index finger.  He reports that the finger has bled excessively since laceration occurred.  He did apply pressure dressing and has not removed the dressing and is unsure of the depth of the laceration.  Patient is unaware of his last tetanus vaccine.  History reviewed. No pertinent past medical history.  There are no problems to display for this patient.   History reviewed. No pertinent surgical history.     Home Medications    Prior to Admission medications   Medication Sig Start Date End Date Taking? Authorizing Provider  benzonatate (TESSALON) 200 MG capsule Take 1 capsule (200 mg total) by mouth 3 (three) times daily as needed for cough. 09/23/18   Wynona Luna, MD    Family History History reviewed. No pertinent family history.  Social History Social History   Tobacco Use  . Smoking status: Never Smoker  . Smokeless tobacco: Never Used  Substance Use Topics  . Alcohol use: Yes  . Drug use: No     Allergies   Patient has no known allergies.   Review of Systems Review of Systems Pertinent negatives listed in HPI Physical Exam Triage Vital Signs ED Triage Vitals  Enc Vitals Group     BP 12/21/19 1818 130/85     Pulse Rate 12/21/19 1818 64     Resp 12/21/19 1818 18     Temp 12/21/19 1818 97.9 F (36.6 C)     Temp Source 12/21/19 1818 Oral     SpO2 12/21/19 1818 98 %     Weight --      Height --      Head Circumference --      Peak Flow --      Pain Score 12/21/19 1816 8     Pain Loc --      Pain Edu? --      Excl. in [place]? --    No data found.  Updated Vital Signs BP  130/85 (BP Location: Left Arm)   Pulse 64   Temp 97.9 F (36.6 C) (Oral)   Resp 18   SpO2 98%   Visual Acuity Right Eye Distance:   Left Eye Distance:   Bilateral Distance:    Right Eye Near:   Left Eye Near:    Bilateral Near:     Physical Exam General appearance: alert, well developed, well nourished, cooperative and in no distress Head: Normocephalic, without obvious abnormality, atraumatic Respiratory: Respirations even and unlabored, normal respiratory rate Heart: rate and rhythm normal.  Extremities: Right index finger laceration  Skin: Skin color, texture, turgor normal. No rashes seen  Psych: Appropriate mood and affect. Neurologic: Alert, oriented to person, place, and time, thought content appropriate. UC Treatments / Results  Labs (all labs ordered are listed, but only abnormal results are displayed) Labs Reviewed - No data to display  EKG   Radiology No results found.  Procedures Laceration Repair  Date/Time: 12/21/2019 7:49 PM Performed by: Scot Jun, FNP Authorized by: Scot Jun, FNP   Consent:    Consent obtained:  Verbal   Consent given by:  Patient   Risks discussed:  Infection   Alternatives discussed:  No treatment Anesthesia (see MAR for exact dosages):    Anesthesia method:  Local infiltration   Local anesthetic:  Lidocaine 2% w/o epi Laceration details:    Location:  Finger   Finger location:  R index finger Repair type:    Repair type:  Simple Exploration:    Hemostasis achieved with:  Tourniquet   Wound exploration: wound explored through full range of motion     Contaminated: no   Treatment:    Area cleansed with:  Hibiclens   Irrigation solution:  Sterile saline Skin repair:    Repair method:  Sutures   Suture size:  3-0   Suture material:  Nylon   Suture technique:  Simple interrupted   Number of sutures:  3 Approximation:    Approximation:  Close Post-procedure details:    Dressing:  Non-adherent  dressing   Patient tolerance of procedure:  Tolerated well, no immediate complications Comments:     Jordan Likes assisted with procedure.   (including critical care time)  Medications Ordered in UC Medications - No data to display  Initial Impression / Assessment and Plan / UC Course  I have reviewed the triage vital signs and the nursing notes.  Pertinent labs & imaging results that were available during my care of the patient were reviewed by me and considered in my medical decision making (see chart for details).    Laceration of right index finger without foreign body without damage to nail, initial encounter -TDAP given.   -Acetaminophen 1000 mg 1 times dose given here in clinic -Educated on signs and symptoms of infection. -Patient will return in 7 days for suture removal. Final Clinical Impressions(s) / UC Diagnoses   Final diagnoses:  Laceration of right index finger without foreign body without damage to nail, initial encounter     Discharge Instructions     Return in 7 days for suture removal. Continue to monitor for signs of infection. Change bandage if soiled.    ED Prescriptions    None     PDMP not reviewed this encounter.   Bing Neighbors, FNP 12/21/19 1956

## 2019-12-21 NOTE — ED Triage Notes (Signed)
Patient reports he was cutting his child's hair this afternoon and cut his finger.

## 2019-12-28 ENCOUNTER — Ambulatory Visit (HOSPITAL_COMMUNITY)
Admission: EM | Admit: 2019-12-28 | Discharge: 2019-12-28 | Disposition: A | Discharge status: Home / Self Care | Payer: 59

## 2019-12-28 ENCOUNTER — Other Ambulatory Visit: Payer: Self-pay

## 2019-12-28 NOTE — ED Notes (Signed)
I removed pt's stitches. Wound was dry and intact. Pt tolerated well.

## 2021-01-28 ENCOUNTER — Encounter: Payer: Self-pay | Admitting: Physician Assistant

## 2021-01-28 ENCOUNTER — Telehealth: Payer: 59 | Admitting: Physician Assistant

## 2021-01-28 DIAGNOSIS — U071 COVID-19: Secondary | ICD-10-CM | POA: Diagnosis not present

## 2021-01-28 MED ORDER — ALBUTEROL SULFATE HFA 108 (90 BASE) MCG/ACT IN AERS: 2.0000 | INHALATION_SPRAY | RESPIRATORY_TRACT | 0 refills | Status: DC | PRN

## 2021-01-28 MED ORDER — AEROCHAMBER PLUS FLO-VU MEDIUM MISC: 1.0000 | Freq: Once | 0 refills | Status: AC

## 2021-01-28 MED ORDER — NIRMATRELVIR/RITONAVIR (PAXLOVID)TABLET: ORAL_TABLET | ORAL | 0 refills | Status: DC

## 2021-01-28 NOTE — Progress Notes (Signed)
Eric Mack, Eric Mack are scheduled for a virtual visit with your provider today.    Just as we do with appointments in the office, we must obtain your consent to participate.  Your consent will be active for this visit and any virtual visit you may have with one of our providers in the next 365 days.    If you have a MyChart account, I can also send a copy of this consent to you electronically.  All virtual visits are billed to your insurance company just like a traditional visit in the office.  As this is a virtual visit, video technology does not allow for your provider to perform a traditional examination.  This may limit your provider's ability to fully assess your condition.  If your provider identifies any concerns that need to be evaluated in person or the need to arrange testing such as labs, EKG, etc, we will make arrangements to do so.    Although advances in technology are sophisticated, we cannot ensure that it will always work on either your end or our end.  If the connection with a video visit is poor, we may have to switch to a telephone visit.  With either a video or telephone visit, we are not always able to ensure that we have a secure connection.   I need to obtain your verbal consent now.   Are you willing to proceed with your visit today?   Eric Mack has provided verbal consent on 01/28/2021 for a virtual visit (video or telephone).   Eric Forth, PA-C 01/28/2021  6:37 PM   Date:  01/28/2021   ID:  Eric Mack, DOB Dec 04, 1980, MRN [id number]  Patient Location: Home Provider Location: Home Office   Participants: Patient and Provider for Visit and Wrap up  Method of visit: Video  Location of Patient: Home Location of Provider: Home Office Consent was obtain for visit over the video. Services rendered by provider: Visit was performed via video  A video enabled telemedicine application was used and I verified that I am speaking with the correct person using two  identifiers.  PCP:  Patient, No Pcp Per (Inactive)   Chief Complaint:  COVID +  History of Present Illness:    Eric Mack is a 40 y.o. male with history as stated below. Presents video telehealth for an acute care visit  Symptoms started 3 days ago.  He reports cough, fatigue, body aches.    Denies chest pain, SOB, abd pain, V/D.    No major medical problems. No daily medications.  Pt has been vaccinated for COVID-19.  The patient does have symptoms concerning for COVID-19 infection (fever, chills, cough, or new shortness of breath).  Patient has been tested for COVID during this illness - positive.  Past Medical, Surgical, Social History, Allergies, and Medications have been Reviewed.  There are no problems to display for this patient.   Social History   Tobacco Use  . Smoking status: Never Smoker  . Smokeless tobacco: Never Used  Substance Use Topics  . Alcohol use: Yes     Current Outpatient Medications:  .  albuterol (VENTOLIN HFA) 108 (90 Base) MCG/ACT inhaler, Inhale 2 puffs into the lungs every 2 (two) hours as needed for wheezing or shortness of breath (cough)., Disp: 8 g, Rfl: 0 .  nirmatrelvir/ritonavir EUA (PAXLOVID) TABS, Take nirmatrelvir (150 mg) two tablets twice daily for 5 days and ritonavir (100 mg) one tablet twice daily for 5 days., Disp: 30 tablet,  Rfl: 0 .  Spacer/Aero-Holding Chambers (AEROCHAMBER PLUS FLO-VU MEDIUM) MISC, 1 each by Other route once for 1 dose., Disp: 1 each, Rfl: 0 .  benzonatate (TESSALON) 200 MG capsule, Take 1 capsule (200 mg total) by mouth 3 (three) times daily as needed for cough., Disp: 30 capsule, Rfl: 0   No Known Allergies   Review of Systems  Constitutional: Positive for malaise/fatigue. Negative for chills and fever.  HENT: Positive for congestion. Negative for ear pain and sore throat.   Eyes: Negative for blurred vision and double vision.  Respiratory: Positive for cough. Negative for shortness of breath and  wheezing.   Cardiovascular: Negative for chest pain, palpitations and leg swelling.  Gastrointestinal: Negative for abdominal pain, diarrhea, nausea and vomiting.  Genitourinary: Negative for dysuria.  Musculoskeletal: Positive for myalgias.  Skin: Negative for rash.  Neurological: Negative for loss of consciousness, weakness and headaches.  Psychiatric/Behavioral: The patient is not nervous/anxious.    See HPI for history of present illness.  Physical Exam Constitutional:      General: He is not in acute distress.    Appearance: Normal appearance. He is well-developed. He is not ill-appearing.  HENT:     Head: Normocephalic and atraumatic.     Nose: Nose normal.  Eyes:     General: No scleral icterus.    Conjunctiva/sclera: Conjunctivae normal.  Pulmonary:     Effort: Pulmonary effort is normal.     Comments: Speaks in full sentences, no accessory muscle usage Musculoskeletal:        General: Normal range of motion.     Cervical back: Normal range of motion.  Skin:    Coloration: Skin is not pale.  Neurological:     General: No focal deficit present.     Mental Status: He is alert.  Psychiatric:        Mood and Affect: Mood normal.               A&P  1. COVID-19  - mild symptoms onset less than 5 days  - no medications or herbal treatments  - will write for Paxlovid  - Albuterol for wheezing, cough, SOB   Patient voiced understanding and agreement to plan.   Time:   Today, I have spent 15 minutes with the patient with telehealth technology discussing the above problems, reviewing the chart, previous notes, medications and orders.    Tests Ordered: No orders of the defined types were placed in this encounter.   Medication Changes: Meds ordered this encounter  Medications  . nirmatrelvir/ritonavir EUA (PAXLOVID) TABS    Sig: Take nirmatrelvir (150 mg) two tablets twice daily for 5 days and ritonavir (100 mg) one tablet twice daily for 5 days.    Dispense:   30 tablet    Refill:  0  . albuterol (VENTOLIN HFA) 108 (90 Base) MCG/ACT inhaler    Sig: Inhale 2 puffs into the lungs every 2 (two) hours as needed for wheezing or shortness of breath (cough).    Dispense:  8 g    Refill:  0  . Spacer/Aero-Holding Chambers (AEROCHAMBER PLUS FLO-VU MEDIUM) MISC    Sig: 1 each by Other route once for 1 dose.    Dispense:  1 each    Refill:  0     Disposition:  Follow up Urgent care or ED as needed if symptoms worsen  Signed, Eric Forth, PA-C  01/28/2021 6:43 PM

## 2021-01-28 NOTE — Patient Instructions (Signed)
1. COVID-19  - mild symptoms onset less than 5 days  - no medications or herbal treatments  - will write for Paxlovid  - Albuterol for wheezing, cough, SOB

## 2021-02-11 ENCOUNTER — Telehealth: Payer: Self-pay

## 2021-02-11 NOTE — Telephone Encounter (Signed)
LVM for patient to call back and schedule a new patient appointment with Kandee Keen in July.

## 2021-03-20 ENCOUNTER — Other Ambulatory Visit: Payer: Self-pay

## 2021-03-21 ENCOUNTER — Ambulatory Visit (INDEPENDENT_AMBULATORY_CARE_PROVIDER_SITE_OTHER): Payer: 59 | Admitting: Adult Health

## 2021-03-21 ENCOUNTER — Encounter: Payer: Self-pay | Admitting: Adult Health

## 2021-03-21 VITALS — BP 110/80 | HR 63 | Temp 98.3°F | Ht 70.75 in | Wt 220.0 lb

## 2021-03-21 DIAGNOSIS — E663 Overweight: Secondary | ICD-10-CM | POA: Diagnosis not present

## 2021-03-21 DIAGNOSIS — Z7689 Persons encountering health services in other specified circumstances: Secondary | ICD-10-CM

## 2021-03-21 NOTE — Patient Instructions (Addendum)
It was great seeing you today   Please schedule your physical exam. If you need anything in the meantime, please let me know   Health Maintenance, Male A healthy lifestyle and preventative care can promote health and wellness. Maintain regular health, dental, and eye exams. Eat a healthy diet. Foods like vegetables, fruits, whole grains, low-fat dairy products, and lean protein foods contain the nutrients you need and are low in calories. Decrease your intake of foods high in solid fats, added sugars, and salt. Get information about a proper diet from your health care provider, if necessary. Regular physical exercise is one of the most important things you can do for your health. Most adults should get at least 150 minutes of moderate-intensity exercise (any activity that increases your heart rate and causes you to sweat) each week. In addition, most adults need muscle-strengthening exercises on 2 or more days a week.   Maintain a healthy weight. The body mass index (BMI) is a screening tool to identify possible weight problems. It provides an estimate of body fat based on height and weight. Your health care provider can find your BMI and can help you achieve or maintain a healthy weight. For males 20 years and older: A BMI below 18.5 is considered underweight. A BMI of 18.5 to 24.9 is normal. A BMI of 25 to 29.9 is considered overweight. A BMI of 30 and above is considered obese. Maintain normal blood lipids and cholesterol by exercising and minimizing your intake of saturated fat. Eat a balanced diet with plenty of fruits and vegetables. Blood tests for lipids and cholesterol should begin at age 40 and be repeated every 5 years. If your lipid or cholesterol levels are high, you are over age 6, or you are at high risk for heart disease, you may need your cholesterol levels checked more frequently. Ongoing high lipid and cholesterol levels should be treated with medicines if diet and exercise are not  working. If you smoke, find out from your health care provider how to quit. If you do not use tobacco, do not start. Lung cancer screening is recommended for adults aged 55-80 years who are at high risk for developing lung cancer because of a history of smoking. A yearly low-dose CT scan of the lungs is recommended for people who have at least a 30-pack-year history of smoking and are current smokers or have quit within the past 15 years. A pack year of smoking is smoking an average of 1 pack of cigarettes a day for 1 year (for example, a 30-pack-year history of smoking could mean smoking 1 pack a day for 30 years or 2 packs a day for 15 years). Yearly screening should continue until the smoker has stopped smoking for at least 15 years. Yearly screening should be stopped for people who develop a health problem that would prevent them from having lung cancer treatment. If you choose to drink alcohol, do not have more than 2 drinks per day. One drink is considered to be 12 oz (360 mL) of beer, 5 oz (150 mL) of wine, or 1.5 oz (45 mL) of liquor. Avoid the use of street drugs. Do not share needles with anyone. Ask for help if you need support or instructions about stopping the use of drugs. High blood pressure causes heart disease and increases the risk of stroke. High blood pressure is more likely to develop in: People who have blood pressure in the end of the normal range (100-139/85-89 mm Hg). People  who are overweight or obese. People who are African American. If you are 51-12 years of age, have your blood pressure checked every 3-5 years. If you are 62 years of age or older, have your blood pressure checked every year. You should have your blood pressure measured twice--once when you are at a hospital or clinic, and once when you are not at a hospital or clinic. Record the average of the two measurements. To check your blood pressure when you are not at a hospital or clinic, you can use: An automated  blood pressure machine at a pharmacy. A home blood pressure monitor. If you are 58-12 years old, ask your health care provider if you should take aspirin to prevent heart disease. Diabetes screening involves taking a blood sample to check your fasting blood sugar level. This should be done once every 3 years after age 36 if you are at a normal weight and without risk factors for diabetes. Testing should be considered at a younger age or be carried out more frequently if you are overweight and have at least 1 risk factor for diabetes. Colorectal cancer can be detected and often prevented. Most routine colorectal cancer screening begins at the age of 69 and continues through age 24. However, your health care provider may recommend screening at an earlier age if you have risk factors for colon cancer. On a yearly basis, your health care provider may provide home test kits to check for hidden blood in the stool. A small camera at the end of a tube may be used to directly examine the colon (sigmoidoscopy or colonoscopy) to detect the earliest forms of colorectal cancer. Talk to your health care provider about this at age 88 when routine screening begins. A direct exam of the colon should be repeated every 5-10 years through age 77, unless early forms of precancerous polyps or small growths are found. People who are at an increased risk for hepatitis B should be screened for this virus. You are considered at high risk for hepatitis B if: You were born in a country where hepatitis B occurs often. Talk with your health care provider about which countries are considered high risk. Your parents were born in a high-risk country and you have not received a shot to protect against hepatitis B (hepatitis B vaccine). You have HIV or AIDS. You use needles to inject street drugs. You live with, or have sex with, someone who has hepatitis B. You are a man who has sex with other men (MSM). You get hemodialysis  treatment. You take certain medicines for conditions like cancer, organ transplantation, and autoimmune conditions. Hepatitis C blood testing is recommended for all people born from [birth year] through 1966 and any individual with known risk factors for hepatitis C. Healthy men should no longer receive prostate-specific antigen (PSA) blood tests as part of routine cancer screening. Talk to your health care provider about prostate cancer screening. Testicular cancer screening is not recommended for adolescents or adult males who have no symptoms. Screening includes self-exam, a health care provider exam, and other screening tests. Consult with your health care provider about any symptoms you have or any concerns you have about testicular cancer. Practice safe sex. Use condoms and avoid high-risk sexual practices to reduce the spread of sexually transmitted infections (STIs). You should be screened for STIs, including gonorrhea and chlamydia if: You are sexually active and are younger than 24 years. You are older than 24 years, and your health care provider tells  you that you are at risk for this type of infection. Your sexual activity has changed since you were last screened, and you are at an increased risk for chlamydia or gonorrhea. Ask your health care provider if you are at risk. If you are at risk of being infected with HIV, it is recommended that you take a prescription medicine daily to prevent HIV infection. This is called pre-exposure prophylaxis (PrEP). You are considered at risk if: You are a man who has sex with other men (MSM). You are a heterosexual man who is sexually active with multiple partners. You take drugs by injection. You are sexually active with a partner who has HIV. Talk with your health care provider about whether you are at high risk of being infected with HIV. If you choose to begin PrEP, you should first be tested for HIV. You should then be tested every 3 months for as long as  you are taking PrEP. Use sunscreen. Apply sunscreen liberally and repeatedly throughout the day. You should seek shade when your shadow is shorter than you. Protect yourself by wearing long sleeves, pants, a wide-brimmed hat, and sunglasses year round whenever you are outdoors. Tell your health care provider of new moles or changes in moles, especially if there is a change in shape or color. Also, tell your health care provider if a mole is larger than the size of a pencil eraser. A one-time screening for abdominal aortic aneurysm (AAA) and surgical repair of large AAAs by ultrasound is recommended for men aged 65-75 years who are current or former smokers. Stay current with your vaccines (immunizations).   This information is not intended to replace advice given to you by your health care provider. Make sure you discuss any questions you have with your health care provider.   Document Released: 02/20/2008 Document Revised: 09/14/2014 Document Reviewed: 01/19/2011 Elsevier Interactive Patient Education [employer]

## 2021-03-21 NOTE — Progress Notes (Signed)
    Patient presents to clinic today to establish care. He is a pleasant 40 year old male who  has no past medical history on file.   Acute Concerns: Establish Care   Chronic Issues: None  Health Maintenance: Dental -- Routine Vision -- Routine Immunizations -- UTI Diet: Vegen diet  Exercise: Weight lifting and swimming    History reviewed. No pertinent past medical history.  History reviewed. No pertinent surgical history.  No current outpatient medications on file prior to visit.   No current facility-administered medications on file prior to visit.    No Known Allergies  Family History  Problem Relation Age of Onset   Mental illness Father     Social History   Socioeconomic History   Marital status: Married    Spouse name: Not on file   Number of children: Not on file   Years of education: Not on file   Highest education level: Not on file  Occupational History   Not on file  Tobacco Use   Smoking status: Never   Smokeless tobacco: Never  Substance and Sexual Activity   Alcohol use: Yes   Drug use: No   Sexual activity: Not on file  Other Topics Concern   Not on file  Social History Narrative   Not on file   Social Determinants of Health   Financial Resource Strain: Not on file  Food Insecurity: Not on file  Transportation Needs: Not on file  Physical Activity: Not on file  Stress: Not on file  Social Connections: Not on file  Intimate Partner Violence: Not on file    Review of Systems  Constitutional: Negative.   HENT: Negative.    Eyes: Negative.   Respiratory: Negative.    Cardiovascular: Negative.   Gastrointestinal: Negative.   Genitourinary: Negative.   Musculoskeletal: Negative.   Skin: Negative.   Neurological: Negative.   Endo/Heme/Allergies: Negative.   Psychiatric/Behavioral: Negative.    All other systems reviewed and are negative.  BP 110/80   Pulse 63   Temp 98.3 F (36.8 C) (Oral)   Ht 5' 10.75" (1.797 m)   Wt  220 lb (99.8 kg)   SpO2 98%   BMI 30.90 kg/m   Physical Exam Vitals and nursing note reviewed.  Constitutional:      Appearance: Normal appearance. He is overweight.  Cardiovascular:     Rate and Rhythm: Normal rate and regular rhythm.     Pulses: Normal pulses.     Heart sounds: Normal heart sounds.  Pulmonary:     Effort: Pulmonary effort is normal.     Breath sounds: Normal breath sounds.  Musculoskeletal:        General: Normal range of motion.  Skin:    General: Skin is warm and dry.     Capillary Refill: Capillary refill takes less than 2 seconds.  Neurological:     General: No focal deficit present.     Mental Status: He is alert and oriented to person, place, and time.  Psychiatric:        Mood and Affect: Mood normal.        Behavior: Behavior normal.        Thought Content: Thought content normal.        Judgment: Judgment normal.    Assessment/Plan: 1. Encounter to establish care - Follow up for CPE  2. Overweight - Actively working on weight loss through diet and exercise

## 2021-03-27 ENCOUNTER — Encounter (HOSPITAL_BASED_OUTPATIENT_CLINIC_OR_DEPARTMENT_OTHER): Payer: Self-pay

## 2021-03-27 ENCOUNTER — Emergency Department (HOSPITAL_BASED_OUTPATIENT_CLINIC_OR_DEPARTMENT_OTHER): Payer: 59

## 2021-03-27 ENCOUNTER — Emergency Department (HOSPITAL_BASED_OUTPATIENT_CLINIC_OR_DEPARTMENT_OTHER)
Admission: EM | Admit: 2021-03-27 | Discharge: 2021-03-28 | Disposition: A | Discharge status: Home / Self Care | Payer: 59 | Attending: Emergency Medicine | Admitting: Emergency Medicine

## 2021-03-27 ENCOUNTER — Other Ambulatory Visit: Payer: Self-pay

## 2021-03-27 DIAGNOSIS — X501XXA Overexertion from prolonged static or awkward postures, initial encounter: Secondary | ICD-10-CM | POA: Diagnosis not present

## 2021-03-27 DIAGNOSIS — Y9389 Activity, other specified: Secondary | ICD-10-CM | POA: Diagnosis not present

## 2021-03-27 DIAGNOSIS — S99911A Unspecified injury of right ankle, initial encounter: Secondary | ICD-10-CM | POA: Insufficient documentation

## 2021-03-27 MED ORDER — IBUPROFEN 800 MG PO TABS
800.0000 mg | ORAL_TABLET | Freq: Once | ORAL | Status: AC
  Administered 2021-03-27: 800 mg via ORAL
  Filled 2021-03-27: qty 1

## 2021-03-27 NOTE — ED Triage Notes (Signed)
Pt c/o ankle injury - states while  he was cleaning his pool, missed a step and twisted his right ankle this afternoon  - c/o pain and swelling -   Denies LOC

## 2021-03-27 NOTE — ED Provider Notes (Signed)
[facility] EMERGENCY DEPT Provider Note   CSN: [id number] Arrival date & time: 03/27/21  1939     History Chief Complaint  Patient presents with   Joint Swelling    Eric Mack is a 40 y.o. male.  40 yo M who presents to the ED today for ankle pain and swelling after slipping on steps in pool. No other injuries. Has hurt this ankle many years before.        History reviewed. No pertinent past medical history.  There are no problems to display for this patient.   History reviewed. No pertinent surgical history.     Family History  Problem Relation Age of Onset   Mental illness Father     Social History   Tobacco Use   Smoking status: Never   Smokeless tobacco: Never  Substance Use Topics   Alcohol use: Yes   Drug use: No    Home Medications Prior to Admission medications   Not on File    Allergies    Patient has no known allergies.  Review of Systems   Review of Systems  All other systems reviewed and are negative.  Physical Exam Updated Vital Signs BP (!) 152/74 (BP Location: Right Arm)   Pulse 68   Temp 98.3 F (36.8 C) (Oral)   Resp 18   Ht 5\' 10"  (1.778 m)   Wt 99.7 kg   SpO2 100%   BMI 31.54 kg/m   Physical Exam Vitals and nursing note reviewed.  Constitutional:      Appearance: He is well-developed.  HENT:     Head: Normocephalic and atraumatic.     Mouth/Throat:     Mouth: Mucous membranes are moist.     Pharynx: Oropharynx is clear.  Eyes:     Pupils: Pupils are equal, round, and reactive to light.  Cardiovascular:     Rate and Rhythm: Normal rate.  Pulmonary:     Effort: Pulmonary effort is normal. No respiratory distress.  Abdominal:     General: There is no distension.  Musculoskeletal:        General: Swelling (right ankle) and tenderness present. Normal range of motion.     Cervical back: Normal range of motion.  Skin:    General: Skin is warm and dry.  Neurological:     General: No focal  deficit present.     Mental Status: He is alert.    ED Results / Procedures / Treatments   Labs (all labs ordered are listed, but only abnormal results are displayed) Labs Reviewed - No data to display  EKG None  Radiology DG Ankle Complete Right  Result Date: 03/27/2021 CLINICAL DATA:  Trauma.  Pain and swelling. EXAM: RIGHT ANKLE - COMPLETE 3+ VIEW COMPARISON:  None. FINDINGS: There is no evidence of fracture, dislocation, or joint effusion. Os trigonum noted. There is no evidence of arthropathy or other focal bone abnormality. Subcutaneus soft tissue edema. IMPRESSION: No acute displaced fracture or dislocation. Electronically Signed   By: 03/29/2021 M.D.   On: 03/27/2021 21:05    Procedures Procedures   Medications Ordered in ED Medications  ibuprofen (ADVIL) tablet 800 mg (800 mg Oral Given 03/27/21 2202)  oxyCODONE-acetaminophen (PERCOCET/ROXICET) 5-325 MG per tablet 2 tablet (2 tablets Oral Given 03/28/21 0012)    ED Course  I have reviewed the triage vital signs and the nursing notes.  Pertinent labs & imaging results that were available during my care of the patient were reviewed by  me and considered in my medical decision making (see chart for details).    MDM Rules/Calculators/A&P                           R ankle swelling and edema likely 2/2 ligamentous injury. Cam walker provided. Ortho fu if not improving in 5-7 days.   Final Clinical Impression(s) / ED Diagnoses Final diagnoses:  Injury of right ankle, initial encounter    Rx / DC Orders ED Discharge Orders     None        Hamzeh Tall, Barbara Cower, MD 03/29/21 [phone]

## 2021-03-28 MED ORDER — OXYCODONE-ACETAMINOPHEN 5-325 MG PO TABS
2.0000 | ORAL_TABLET | Freq: Once | ORAL | Status: AC
  Administered 2021-03-28: 2 via ORAL
  Filled 2021-03-28: qty 2

## 2021-05-01 ENCOUNTER — Encounter: Payer: Self-pay | Admitting: Adult Health

## 2021-05-01 ENCOUNTER — Other Ambulatory Visit: Payer: Self-pay

## 2021-05-01 ENCOUNTER — Ambulatory Visit (INDEPENDENT_AMBULATORY_CARE_PROVIDER_SITE_OTHER): Payer: 59 | Admitting: Adult Health

## 2021-05-01 VITALS — BP 120/82 | HR 84 | Temp 98.6°F | Ht 71.25 in | Wt 217.0 lb

## 2021-05-01 DIAGNOSIS — M25571 Pain in right ankle and joints of right foot: Secondary | ICD-10-CM | POA: Diagnosis not present

## 2021-05-01 DIAGNOSIS — Z0000 Encounter for general adult medical examination without abnormal findings: Secondary | ICD-10-CM | POA: Diagnosis not present

## 2021-05-01 DIAGNOSIS — Z114 Encounter for screening for human immunodeficiency virus [HIV]: Secondary | ICD-10-CM

## 2021-05-01 DIAGNOSIS — Z1159 Encounter for screening for other viral diseases: Secondary | ICD-10-CM

## 2021-05-01 LAB — CBC WITH DIFFERENTIAL/PLATELET
Basophils Absolute: 0.1 10*3/uL (ref 0.0–0.1)
Basophils Relative: 1.2 % (ref 0.0–3.0)
Eosinophils Absolute: 0.2 10*3/uL (ref 0.0–0.7)
Eosinophils Relative: 3.2 % (ref 0.0–5.0)
HCT: 42.1 % (ref 39.0–52.0)
Hemoglobin: 14.4 g/dL (ref 13.0–17.0)
Lymphocytes Relative: 21.2 % (ref 12.0–46.0)
Lymphs Abs: 1.1 10*3/uL (ref 0.7–4.0)
MCHC: 34.1 g/dL (ref 30.0–36.0)
MCV: 93.5 fl (ref 78.0–100.0)
Monocytes Absolute: 0.5 10*3/uL (ref 0.1–1.0)
Monocytes Relative: 8.8 % (ref 3.0–12.0)
Neutro Abs: 3.5 10*3/uL (ref 1.4–7.7)
Neutrophils Relative %: 65.6 % (ref 43.0–77.0)
Platelets: 206 10*3/uL (ref 150.0–400.0)
RBC: 4.5 Mil/uL (ref 4.22–5.81)
RDW: 12.4 % (ref 11.5–15.5)
WBC: 5.3 10*3/uL (ref 4.0–10.5)

## 2021-05-01 LAB — COMPREHENSIVE METABOLIC PANEL
ALT: 22 U/L (ref 0–53)
AST: 21 U/L (ref 0–37)
Albumin: 4.3 g/dL (ref 3.5–5.2)
Alkaline Phosphatase: 107 U/L (ref 39–117)
BUN: 15 mg/dL (ref 6–23)
CO2: 26 mEq/L (ref 19–32)
Calcium: 9.3 mg/dL (ref 8.4–10.5)
Chloride: 105 mEq/L (ref 96–112)
Creatinine, Ser: 0.89 mg/dL (ref 0.40–1.50)
GFR: 106.38 mL/min (ref >60.00)
Glucose, Bld: 92 mg/dL (ref 70–99)
Potassium: 4.5 mEq/L (ref 3.5–5.1)
Sodium: 139 mEq/L (ref 135–145)
Total Bilirubin: 0.7 mg/dL (ref 0.2–1.2)
Total Protein: 6.8 g/dL (ref 6.0–8.3)

## 2021-05-01 LAB — LIPID PANEL
Cholesterol: 157 mg/dL (ref 0–200)
HDL: 43.3 mg/dL (ref >39.00)
LDL Cholesterol: 89 mg/dL (ref 0–99)
NonHDL: 113.28
Total CHOL/HDL Ratio: 4
Triglycerides: 122 mg/dL (ref 0.0–149.0)
VLDL: 24.4 mg/dL (ref 0.0–40.0)

## 2021-05-01 LAB — HEMOGLOBIN A1C: Hgb A1c MFr Bld: 5.5 % (ref 4.6–6.5)

## 2021-05-01 LAB — TSH: TSH: 2.07 u[IU]/mL (ref 0.35–5.50)

## 2021-05-01 NOTE — Progress Notes (Signed)
Subjective:    Patient ID: Eric Mack, male    DOB: 1980/12/17, 40 y.o.   MRN: [id number]  HPI Patient presents for yearly preventative medicine examination. He is a pleasant 40 year old male who  has no past medical history on file.  Right ankle pain - was seen in the ER roughly 1 month ago for right ankle pain and swelling after slipping on steps in the pool.  Strays showed no acute displaced fracture or dislocation.  It was thought to be more of a ligament injury.  He was provided a cam boot which she wore for roughly 3 weeks.  Unfortunately he does not feel as though he has healed much, continues to have pretty significant pain to the lateral aspect of his right ankle.  Swelling has improved.  He has been using ice intermittently as well as elevating his leg.  He has full range of motion but pain with doing so  All immunizations and health maintenance protocols were reviewed with the patient and needed orders were placed.  Appropriate screening laboratory values were ordered for the patient including screening of hyperlipidemia, renal function and hepatic function.  Medication reconciliation,  past medical history, social history, problem list and allergies were reviewed in detail with the patient  Goals were established with regard to weight loss, exercise, and  diet in compliance with medications. He eats a vegan diet and stays active with weight lifting and swimming  Wt Readings from Last 3 Encounters:  05/01/21 217 lb (98.4 kg)  03/27/21 219 lb 12.8 oz (99.7 kg)  03/21/21 220 lb (99.8 kg)    Review of Systems  Constitutional: Negative.   HENT: Negative.    Eyes: Negative.   Respiratory: Negative.    Cardiovascular: Negative.   Gastrointestinal: Negative.   Endocrine: Negative.   Genitourinary: Negative.   Musculoskeletal:  Positive for joint swelling.  Skin: Negative.   Allergic/Immunologic: Negative.   Neurological: Negative.   Hematological: Negative.    Psychiatric/Behavioral: Negative.    All other systems reviewed and are negative.  History reviewed. No pertinent past medical history.  Social History   Socioeconomic History   Marital status: Married    Spouse name: Not on file   Number of children: Not on file   Years of education: Not on file   Highest education level: Not on file  Occupational History   Not on file  Tobacco Use   Smoking status: Never   Smokeless tobacco: Never  Substance and Sexual Activity   Alcohol use: Yes   Drug use: No   Sexual activity: Not on file  Other Topics Concern   Not on file  Social History Narrative   Not on file   Social Determinants of Health   Financial Resource Strain: Not on file  Food Insecurity: Not on file  Transportation Needs: Not on file  Physical Activity: Not on file  Stress: Not on file  Social Connections: Not on file  Intimate Partner Violence: Not on file    History reviewed. No pertinent surgical history.  Family History  Problem Relation Age of Onset   Mental illness Father     No Known Allergies  No current outpatient medications on file prior to visit.   No current facility-administered medications on file prior to visit.    BP 120/82   Pulse 84   Temp 98.6 F (37 C) (Oral)   Ht 5' 11.25" (1.81 m)   Wt 217 lb (98.4 kg)  SpO2 97%   BMI 30.05 kg/m        Objective:   Physical Exam Vitals and nursing note reviewed.  Constitutional:      General: He is not in acute distress.    Appearance: Normal appearance. He is well-developed and overweight.  HENT:     Head: Normocephalic and atraumatic.     Right Ear: Tympanic membrane, ear canal and external ear normal. There is no impacted cerumen.     Left Ear: Tympanic membrane, ear canal and external ear normal. There is no impacted cerumen.     Nose: Nose normal. No congestion or rhinorrhea.     Mouth/Throat:     Mouth: Mucous membranes are moist.     Pharynx: Oropharynx is clear. No  oropharyngeal exudate or posterior oropharyngeal erythema.  Eyes:     General:        Right eye: No discharge.        Left eye: No discharge.     Extraocular Movements: Extraocular movements intact.     Conjunctiva/sclera: Conjunctivae normal.     Pupils: Pupils are equal, round, and reactive to light.  Neck:     Vascular: No carotid bruit.     Trachea: No tracheal deviation.  Cardiovascular:     Rate and Rhythm: Normal rate and regular rhythm.     Pulses: Normal pulses.     Heart sounds: Normal heart sounds. No murmur heard.   No friction rub. No gallop.  Pulmonary:     Effort: Pulmonary effort is normal. No respiratory distress.     Breath sounds: Normal breath sounds. No stridor. No wheezing, rhonchi or rales.  Chest:     Chest wall: No tenderness.  Abdominal:     General: Bowel sounds are normal. There is no distension.     Palpations: Abdomen is soft. There is no mass.     Tenderness: There is no abdominal tenderness. There is no right CVA tenderness, left CVA tenderness, guarding or rebound.     Hernia: No hernia is present.  Musculoskeletal:        General: Tenderness present. No swelling, deformity or signs of injury. Normal range of motion.     Right lower leg: No edema.     Left lower leg: No edema.     Right ankle: No swelling or deformity. Tenderness present over the lateral malleolus. Normal range of motion.  Lymphadenopathy:     Cervical: No cervical adenopathy.  Skin:    General: Skin is warm and dry.     Capillary Refill: Capillary refill takes less than 2 seconds.     Coloration: Skin is not jaundiced or pale.     Findings: No bruising, erythema, lesion or rash.  Neurological:     General: No focal deficit present.     Mental Status: He is alert and oriented to person, place, and time.     Cranial Nerves: No cranial nerve deficit.     Sensory: No sensory deficit.     Motor: No weakness.     Coordination: Coordination normal.     Gait: Gait normal.      Deep Tendon Reflexes: Reflexes normal.  Psychiatric:        Mood and Affect: Mood normal.        Behavior: Behavior normal.        Thought Content: Thought content normal.        Judgment: Judgment normal.      Assessment & Plan:  1. Routine general medical examination at a health care facility - Continue to eat healthy and exercise - Follow up in one year of sooner if needed - CBC with Differential/Platelet; Future - Comprehensive metabolic panel; Future - Hemoglobin A1c; Future - Lipid panel; Future - TSH; Future - Tdap vaccine greater than or equal to 7yo IM  2. Encounter for screening for HIV  - HIV Antibody (routine testing w rflx); Future  3. Need for hepatitis C screening test  - Hep C Antibody; Future  4. Acute right ankle pain -Continued concern for ligament injury.  After 4 weeks I would expect him to have less discomfort.  We will order MRI of the right ankle.  Consider referral to orthopedics.  Continue conservative measures that he has been doing at home. - MR ANKLE RIGHT WO CONTRAST; Future  Shirline Frees, NP

## 2021-05-01 NOTE — Patient Instructions (Signed)
It was great seeing you today   I have ordered an MRI of the right ankle - someone will call you to schedule that. We may need to refer you over to orthopedics after the MRI is finished  We will follow up with you regarding your blood work

## 2021-05-02 LAB — HEPATITIS C ANTIBODY
Hepatitis C Ab: NONREACTIVE
SIGNAL TO CUT-OFF: 0.03 (ref <1.00)

## 2021-05-02 LAB — HIV ANTIBODY (ROUTINE TESTING W REFLEX): HIV 1&2 Ab, 4th Generation: NONREACTIVE

## 2021-05-03 ENCOUNTER — Emergency Department (HOSPITAL_COMMUNITY): Payer: 59 | Admitting: Anesthesiology

## 2021-05-03 ENCOUNTER — Ambulatory Visit (HOSPITAL_COMMUNITY)
Admission: EM | Admit: 2021-05-03 | Discharge: 2021-05-03 | Disposition: A | Discharge status: Home / Self Care | Payer: 59 | Attending: General Surgery | Admitting: General Surgery

## 2021-05-03 ENCOUNTER — Emergency Department (HOSPITAL_COMMUNITY): Payer: 59

## 2021-05-03 ENCOUNTER — Encounter (HOSPITAL_COMMUNITY)
Admission: EM | Disposition: A | Discharge status: Home / Self Care | Payer: Self-pay | Source: Home / Self Care | Attending: Emergency Medicine

## 2021-05-03 ENCOUNTER — Encounter (HOSPITAL_COMMUNITY): Payer: Self-pay

## 2021-05-03 ENCOUNTER — Other Ambulatory Visit: Payer: Self-pay

## 2021-05-03 DIAGNOSIS — S61216A Laceration without foreign body of right little finger without damage to nail, initial encounter: Secondary | ICD-10-CM | POA: Diagnosis not present

## 2021-05-03 DIAGNOSIS — S66120A Laceration of flexor muscle, fascia and tendon of right index finger at wrist and hand level, initial encounter: Secondary | ICD-10-CM | POA: Diagnosis not present

## 2021-05-03 DIAGNOSIS — Z20822 Contact with and (suspected) exposure to covid-19: Secondary | ICD-10-CM | POA: Insufficient documentation

## 2021-05-03 DIAGNOSIS — S61212A Laceration without foreign body of right middle finger without damage to nail, initial encounter: Secondary | ICD-10-CM | POA: Diagnosis not present

## 2021-05-03 DIAGNOSIS — Y9389 Activity, other specified: Secondary | ICD-10-CM | POA: Insufficient documentation

## 2021-05-03 DIAGNOSIS — W3189XA Contact with other specified machinery, initial encounter: Secondary | ICD-10-CM | POA: Diagnosis not present

## 2021-05-03 DIAGNOSIS — S61214A Laceration without foreign body of right ring finger without damage to nail, initial encounter: Secondary | ICD-10-CM | POA: Diagnosis not present

## 2021-05-03 DIAGNOSIS — S61210A Laceration without foreign body of right index finger without damage to nail, initial encounter: Secondary | ICD-10-CM | POA: Diagnosis present

## 2021-05-03 DIAGNOSIS — S61219A Laceration without foreign body of unspecified finger without damage to nail, initial encounter: Secondary | ICD-10-CM

## 2021-05-03 DIAGNOSIS — Z23 Encounter for immunization: Secondary | ICD-10-CM | POA: Insufficient documentation

## 2021-05-03 HISTORY — DX: Other specified health status: Z78.9

## 2021-05-03 HISTORY — PX: I & D EXTREMITY: SHX5045

## 2021-05-03 LAB — RESP PANEL BY RT-PCR (FLU A&B, COVID) ARPGX2
Influenza A by PCR: NEGATIVE
Influenza B by PCR: NEGATIVE
SARS Coronavirus 2 by RT PCR: NEGATIVE

## 2021-05-03 SURGERY — IRRIGATION AND DEBRIDEMENT EXTREMITY
Anesthesia: General | Site: Hand | Laterality: Right

## 2021-05-03 MED ORDER — PROPOFOL 10 MG/ML IV BOLUS
INTRAVENOUS | Status: AC
  Filled 2021-05-03: qty 20

## 2021-05-03 MED ORDER — MORPHINE SULFATE (PF) 4 MG/ML IV SOLN
4.0000 mg | Freq: Once | INTRAVENOUS | Status: AC
  Administered 2021-05-03: 4 mg via INTRAVENOUS
  Filled 2021-05-03: qty 1

## 2021-05-03 MED ORDER — LACTATED RINGERS IV SOLN: INTRAVENOUS | Status: DC

## 2021-05-03 MED ORDER — CHLORHEXIDINE GLUCONATE 0.12 % MT SOLN
OROMUCOSAL | Status: AC
  Administered 2021-05-03: 15 mL via OROMUCOSAL
  Filled 2021-05-03: qty 15

## 2021-05-03 MED ORDER — PROPOFOL 10 MG/ML IV BOLUS
INTRAVENOUS | Status: DC | PRN
  Administered 2021-05-03: 200 mg via INTRAVENOUS

## 2021-05-03 MED ORDER — SODIUM CHLORIDE 0.9 % IV SOLN: Freq: Once | INTRAVENOUS | Status: AC

## 2021-05-03 MED ORDER — FENTANYL CITRATE (PF) 250 MCG/5ML IJ SOLN
INTRAMUSCULAR | Status: DC | PRN
  Administered 2021-05-03: 25 ug via INTRAVENOUS
  Administered 2021-05-03 (×2): 50 ug via INTRAVENOUS

## 2021-05-03 MED ORDER — LIDOCAINE 2% (20 MG/ML) 5 ML SYRINGE
INTRAMUSCULAR | Status: DC | PRN
  Administered 2021-05-03: 60 mg via INTRAVENOUS

## 2021-05-03 MED ORDER — BUPIVACAINE HCL (PF) 0.25 % IJ SOLN
INTRAMUSCULAR | Status: DC | PRN
  Administered 2021-05-03: 10 mL

## 2021-05-03 MED ORDER — DEXAMETHASONE SODIUM PHOSPHATE 10 MG/ML IJ SOLN
INTRAMUSCULAR | Status: DC | PRN
  Administered 2021-05-03: 10 mg via INTRAVENOUS

## 2021-05-03 MED ORDER — FENTANYL CITRATE (PF) 100 MCG/2ML IJ SOLN: 25.0000 ug | INTRAMUSCULAR | Status: DC | PRN

## 2021-05-03 MED ORDER — CEPHALEXIN 500 MG PO CAPS: 500.0000 mg | ORAL_CAPSULE | Freq: Four times a day (QID) | ORAL | 0 refills | Status: DC

## 2021-05-03 MED ORDER — HYDROCODONE-ACETAMINOPHEN 5-325 MG PO TABS: 1.0000 | ORAL_TABLET | ORAL | 0 refills | Status: AC | PRN

## 2021-05-03 MED ORDER — CEFAZOLIN SODIUM-DEXTROSE 2-4 GM/100ML-% IV SOLN
2.0000 g | Freq: Once | INTRAVENOUS | Status: AC
  Administered 2021-05-03: 2 g via INTRAVENOUS
  Filled 2021-05-03: qty 100

## 2021-05-03 MED ORDER — MIDAZOLAM HCL 2 MG/2ML IJ SOLN
INTRAMUSCULAR | Status: DC | PRN
  Administered 2021-05-03: 2 mg via INTRAVENOUS

## 2021-05-03 MED ORDER — FENTANYL CITRATE (PF) 250 MCG/5ML IJ SOLN
INTRAMUSCULAR | Status: AC
  Filled 2021-05-03: qty 5

## 2021-05-03 MED ORDER — ACETAMINOPHEN 10 MG/ML IV SOLN
INTRAVENOUS | Status: AC
  Filled 2021-05-03: qty 100

## 2021-05-03 MED ORDER — ORAL CARE MOUTH RINSE: 15.0000 mL | Freq: Once | OROMUCOSAL | Status: AC

## 2021-05-03 MED ORDER — BSS IO SOLN
INTRAOCULAR | Status: AC
  Filled 2021-05-03: qty 15

## 2021-05-03 MED ORDER — TETANUS-DIPHTH-ACELL PERTUSSIS 5-2.5-18.5 LF-MCG/0.5 IM SUSY
0.5000 mL | PREFILLED_SYRINGE | Freq: Once | INTRAMUSCULAR | Status: AC
  Administered 2021-05-03: 0.5 mL via INTRAMUSCULAR
  Filled 2021-05-03: qty 0.5

## 2021-05-03 MED ORDER — CHLORHEXIDINE GLUCONATE 0.12 % MT SOLN: 15.0000 mL | Freq: Once | OROMUCOSAL | Status: AC

## 2021-05-03 MED ORDER — AMISULPRIDE (ANTIEMETIC) 5 MG/2ML IV SOLN: 10.0000 mg | Freq: Once | INTRAVENOUS | Status: DC | PRN

## 2021-05-03 MED ORDER — BUPIVACAINE HCL (PF) 0.25 % IJ SOLN
INTRAMUSCULAR | Status: AC
  Filled 2021-05-03: qty 10

## 2021-05-03 MED ORDER — ACETAMINOPHEN 10 MG/ML IV SOLN
INTRAVENOUS | Status: DC | PRN
  Administered 2021-05-03: 1000 mg via INTRAVENOUS

## 2021-05-03 MED ORDER — ONDANSETRON HCL 4 MG/2ML IJ SOLN
INTRAMUSCULAR | Status: DC | PRN
  Administered 2021-05-03: 4 mg via INTRAVENOUS

## 2021-05-03 MED ORDER — 0.9 % SODIUM CHLORIDE (POUR BTL) OPTIME
TOPICAL | Status: DC | PRN
  Administered 2021-05-03: 1000 mL

## 2021-05-03 MED ORDER — MIDAZOLAM HCL 2 MG/2ML IJ SOLN
INTRAMUSCULAR | Status: AC
  Filled 2021-05-03: qty 2

## 2021-05-03 SURGICAL SUPPLY — 49 items
BAG COUNTER SPONGE SURGICOUNT (BAG) ×2 IMPLANT
BAG DECANTER FOR FLEXI CONT (MISCELLANEOUS) IMPLANT
BNDG COHESIVE 1X5 TAN STRL LF (GAUZE/BANDAGES/DRESSINGS) ×2 IMPLANT
BNDG CONFORM 3 STRL LF (GAUZE/BANDAGES/DRESSINGS) ×2 IMPLANT
BNDG ELASTIC 3X5.8 VLCR STR LF (GAUZE/BANDAGES/DRESSINGS) ×2 IMPLANT
BNDG ELASTIC 4X5.8 VLCR STR LF (GAUZE/BANDAGES/DRESSINGS) IMPLANT
BNDG GAUZE ELAST 4 BULKY (GAUZE/BANDAGES/DRESSINGS) IMPLANT
CORD BIPOLAR FORCEPS 12FT (ELECTRODE) ×2 IMPLANT
CUFF TOURN SGL QUICK 18X4 (TOURNIQUET CUFF) ×2 IMPLANT
DRAPE SURG 17X23 STRL (DRAPES) ×2 IMPLANT
DRSG XEROFORM 1X8 (GAUZE/BANDAGES/DRESSINGS) ×2 IMPLANT
ELECT REM PT RETURN 9FT ADLT (ELECTROSURGICAL) ×2
ELECTRODE REM PT RTRN 9FT ADLT (ELECTROSURGICAL) ×1 IMPLANT
GAUZE PACKING IODOFORM 1/4X15 (PACKING) IMPLANT
GAUZE SPONGE 4X4 12PLY STRL (GAUZE/BANDAGES/DRESSINGS) ×2 IMPLANT
GAUZE XEROFORM 1X8 LF (GAUZE/BANDAGES/DRESSINGS) ×2 IMPLANT
GLOVE SURG ENC TEXT LTX SZ8 (GLOVE) ×4 IMPLANT
GOWN STRL REUS W/ TWL LRG LVL3 (GOWN DISPOSABLE) ×2 IMPLANT
GOWN STRL REUS W/TWL LRG LVL3 (GOWN DISPOSABLE) ×2
HANDPIECE INTERPULSE COAX TIP (DISPOSABLE)
IV NS IRRIG 3000ML ARTHROMATIC (IV SOLUTION) IMPLANT
KIT BASIN OR (CUSTOM PROCEDURE TRAY) ×2 IMPLANT
KIT TURNOVER KIT B (KITS) ×2 IMPLANT
MANIFOLD NEPTUNE II (INSTRUMENTS) IMPLANT
NEEDLE HYPO 25GX1X1/2 BEV (NEEDLE) ×2 IMPLANT
NS IRRIG 1000ML POUR BTL (IV SOLUTION) ×2 IMPLANT
PACK ORTHO EXTREMITY (CUSTOM PROCEDURE TRAY) ×2 IMPLANT
PAD ARMBOARD 7.5X6 YLW CONV (MISCELLANEOUS) ×4 IMPLANT
PAD CAST 4YDX4 CTTN HI CHSV (CAST SUPPLIES) IMPLANT
PADDING CAST COTTON 4X4 STRL (CAST SUPPLIES)
SET CYSTO W/LG BORE CLAMP LF (SET/KITS/TRAYS/PACK) IMPLANT
SET HNDPC FAN SPRY TIP SCT (DISPOSABLE) IMPLANT
SOAP 2 % CHG 4 OZ (WOUND CARE) IMPLANT
SPLINT FINGER 4.25 BULB 911906 (SOFTGOODS) ×2 IMPLANT
SPONGE T-LAP 18X18 ~~LOC~~+RFID (SPONGE) ×2 IMPLANT
SPONGE T-LAP 4X18 ~~LOC~~+RFID (SPONGE) IMPLANT
SUT ETHILON 5 0 P 3 18 (SUTURE) ×2
SUT ETHILON 5 0 PS 2 18 (SUTURE) ×2 IMPLANT
SUT FIBERWIRE 4-0 18 DIAM BLUE (SUTURE) ×2
SUT NYLON ETHILON 5-0 P-3 1X18 (SUTURE) ×2 IMPLANT
SUT PROLENE 5 0 RB 1 DA (SUTURE) ×2 IMPLANT
SUTURE FIBERWR 4-0 18 DIA BLUE (SUTURE) ×1 IMPLANT
SWAB CULTURE ESWAB REG 1ML (MISCELLANEOUS) IMPLANT
SYR CONTROL 10ML LL (SYRINGE) ×2 IMPLANT
TOWEL GREEN STERILE (TOWEL DISPOSABLE) ×2 IMPLANT
TOWEL GREEN STERILE FF (TOWEL DISPOSABLE) ×2 IMPLANT
TUBE CONNECTING 12X1/4 (SUCTIONS) ×2 IMPLANT
WATER STERILE IRR 1000ML POUR (IV SOLUTION) ×2 IMPLANT
YANKAUER SUCT BULB TIP NO VENT (SUCTIONS) ×2 IMPLANT

## 2021-05-03 NOTE — Op Note (Signed)
NAME: Eric Mack, Eric Mack MEDICAL RECORD NO: [id number] ACCOUNT NO: [id number] DATE OF BIRTH: [birth year] FACILITY: [facility] LOCATION: [facility] PHYSICIAN: Temprence Rhines C. Izora Ribas, MD  Operative Report   DATE OF PROCEDURE: 05/03/2021  PREOPERATIVE DIAGNOSIS:  Lacerations to the right index, right long, right ring and right small fingers.  POSTOPERATIVE DIAGNOSIS:  Lacerations to the right index, right long, right ring and right small fingers.  PROCEDURE: 1.  Exploration of wounds of the right small, right ring, right long and right index fingers. 2.  Repair of lacerations to the right small, right ring, right long fingers measuring 5.5 cm. 3.  Repair of distal insertion of the FDP tendon to the right index finger and repair of complex laceration to the right index finger measuring 3 cm.  ANESTHESIA:  General.  No acute complications.  ESTIMATED BLOOD LOSS:  Minimal.  INDICATIONS:  The patient is a 40 year old gentleman who cut his fingers on a hedge trimmer this afternoon, presented to the emergency department.  I was consulted.  Risks, benefits and alternatives of exploration and repair of these wounds were  discussed with the patient.  He agreed with course of action and surgery.  Consent was obtained.  DESCRIPTION OF PROCEDURE:  The patient was taken to the operating room and placed supine on the operating room table.  Timeout was performed.  Preoperative antibiotics were given.  Anesthesia was administered without difficulty.  The right upper  extremity was prepped and draped in the normal sterile fashion.  The arm was exsanguinated.  A tourniquet was used on the upper arm, inflated to 250 mmHg.  Beginning with the right small finger, exploration of the wound was performed.  The wound was not  down to the flexor tendon sheath.  It did not involve the neurovascular structures.  The wound was contaminated with some dirt and grass.  This was removed.  The skin edges were sharply debrided with a knife  and scissors to make a nice clean wound for  Suture closure.  This wound was thoroughly irrigated and closed with multiple 5-0 nylon sutures.  Next, each of the ring and long fingers were both explored.  Full thickness skin and subcutaneous tissues were sharply debrided with scissors.  Neurovascular structures were intact on both of  these fingers and the flexor tendon sheath was not violated.  After thorough irrigation these were closed with 5-0 nylon. On the index finger, this was more a complex wound.  There was a jagged irregular wound on the radial aspect of the index finger at the DIP joint.  The DIP joint was actually entered.  The  radial collateral ligament was cut and the flexor digitorum profundus tendon was cut just proximal to the distal most insertion.  This wound was irrigated thoroughly.  All skin edges were debrided with scissors including full thickness skin and  subcutaneous tissue.  The distal flexor tendon sheath was opened.  The FDP tendon was in very close proximity to this, the wound was explored more proximally where a separate laceration was located; however, the tendon sheath and tendon was not cut  here.  The wound had to be opened up additionally proximally to gain adequate access and for exploration. The ulnar side and the neurovascular structures were intact. On the radial side, the digital nerve was cut in 2 places and its ends were very frayed ,as can be expected from a saw injury. The nerve was not suitable for repair.  Next, the FDP tendon was repaired with modified  Kessler style with 4-0 FiberWire.  The radial collateral ligament was approximated as well.  5-0 Prolene was also used  on the anterior portion of the FDP repair in a running type fashion.  Next, the wound was closed with multiple 5-0 nylon sutures.  Afterwards, the tourniquet was released. All fingertips returned to a nice pink color.  A sterile dressing and index  finger splint were placed.  The patient  tolerated the procedure well and was taken to recovery room stable.   SHW D: 05/03/2021 6:13:40 pm T: 05/03/2021 8:33:00 pm  JOB: [id number]/ [id number]

## 2021-05-03 NOTE — Anesthesia Procedure Notes (Signed)
Procedure Name: LMA Insertion Date/Time: 05/03/2021 4:55 PM Performed by: Dairl Ponder, CRNA Pre-anesthesia Checklist: Patient identified, Emergency Drugs available, Suction available and Patient being monitored Patient Re-evaluated:Patient Re-evaluated prior to induction Oxygen Delivery Method: Circle System Utilized Preoxygenation: Pre-oxygenation with 100% oxygen Induction Type: IV induction Ventilation: Mask ventilation without difficulty LMA: LMA inserted LMA Size: 4.0 Number of attempts: 1 Airway Equipment and Method: Bite block Placement Confirmation: positive ETCO2 and breath sounds checked- equal and bilateral Tube secured with: Tape Dental Injury: Teeth and Oropharynx as per pre-operative assessment

## 2021-05-03 NOTE — H&P (Signed)
Reason for Consult:lacerations R fingers Referring Physician: ER  CC:I cut my fingers  HPI:  Eric Mack is an 40 y.o. left handed male who presents with  lacerations to right IF, LF, RF, SF.  Pt was trimming bushes with a hedge trimmer and cut his fingers .   Pain is rated at    8/10 and is described as sharp.  Pain is constant.  Pain is made better by rest/immobilization, worse with motion.   Associated signs/symptoms: altered sensation of IF Previous treatment:    History reviewed. No pertinent past medical history.  No past surgical history on file.  Family History  Problem Relation Age of Onset   Mental illness Father     Social History:  reports that he has never smoked. He has never used smokeless tobacco. He reports current alcohol use. He reports that he does not use drugs.  Allergies: No Known Allergies  Medications: I have reviewed the patient's current medications.  No results found for this or any previous visit (from the past 48 hour(s)).  DG Hand 2 View Right  Result Date: 05/03/2021 CLINICAL DATA:  Hand laceration to all 4 digits.  Head clippers. EXAM: RIGHT HAND - 2 VIEW COMPARISON:  None. FINDINGS: Lacerations are identified in the second and fifth digits. No foreign bodies identified. No fractures. IMPRESSION: Lacerations.  No foreign bodies or fractures identified. Electronically Signed   By: Gerome Sam III M.D.   On: 05/03/2021 14:31    Pertinent items noted in HPI and remainder of comprehensive ROS otherwise negative. Temp:  [98.4 F (36.9 C)] 98.4 F (36.9 C) (08/27 1540) Pulse Rate:  [80-87] 80 (08/27 1540) Resp:  [16-20] 18 (08/27 1540) BP: (137-142)/(85-96) 142/85 (08/27 1540) SpO2:  [96 %-99 %] 97 % (08/27 1540) General appearance: alert and cooperative Resp: clear to auscultation bilaterally Cardio: regular rate and rhythm GI: soft, non-tender; bowel sounds normal; no masses,  no organomegaly Extremities: extremities normal, atraumatic,  no cyanosis or edema Except for R Hand:  IF with dual laceration at dip crease, ? Unsure if FDP intact , altered sensation radial tip; LF, RF, SF with distal volar lacerations, seem fairly superficial, able to flex pi; and dip with some effort; n/v intact  Assessment: Lacerations of RIF, RLF,RRF, RSF - ? Tendon nerve injury IF Plan: Will explore all wounds and repair structures as able. I have discussed this treatment plan in detail with patient and family, including the risks of the recommended treatment and surgery, the benefits and the alternatives, including loss of sensation, loss of function, infection. The patient understands that additional treatment may be necessary.  Chanz Cahall C Medha Pippen 05/03/2021, 4:21 PM

## 2021-05-03 NOTE — Anesthesia Postprocedure Evaluation (Signed)
Anesthesia Post Note  Patient: Eric Mack  Procedure(s) Performed: IRRIGATION AND DEBRIDEMENT HAND (Right: Hand)     Patient location during evaluation: PACU Anesthesia Type: General Level of consciousness: awake and alert Pain management: pain level controlled Vital Signs Assessment: post-procedure vital signs reviewed and stable Respiratory status: spontaneous breathing, nonlabored ventilation, respiratory function stable and patient connected to nasal cannula oxygen Cardiovascular status: blood pressure returned to baseline and stable Postop Assessment: no apparent nausea or vomiting Anesthetic complications: no   No notable events documented.  Last Vitals:  Vitals:   05/03/21 1833 05/03/21 1848  BP: 107/74 (!) 139/91  Pulse: 68 68  Resp: 16 14  Temp:  (!) 36.1 C  SpO2: 100% 95%    Last Pain:  Vitals:   05/03/21 1848  TempSrc:   PainSc: 0-No pain                 Kennieth Rad

## 2021-05-03 NOTE — Discharge Instructions (Signed)
Discharge Instructions:  Keep your dressing clean, dry and in place until instructed to remove by Dr. Raegyn Renda.  If the dressing becomes dirty or wet call the office for instructions during business hours. Elevate the extremity to help with swelling, this will also help with any discomfort. Take your medication as prescribed. No lifting with the injured  extremity. If you feel that the dressing is too tight, you may loosen it, but keep it on; finger tips should be pink; if there is a concern, call the office. [phone] Ice may be used if the injury is a fracture, do not apply ice directly to the skin. Please call the office on the next business day after discharge to arrange a follow up appointment.  Call [phone] between the hours of 9am - 5pm M-Th or 9am - 1pm on Fri. For most hand injuries and/or conditions, you may return to work using the uninjured hand (one handed duty) within 24-72 hours.  A detailed note will be provided to you at your follow up appointment or may contact the office prior to your follow up.    

## 2021-05-03 NOTE — Anesthesia Preprocedure Evaluation (Addendum)
Anesthesia Evaluation  Patient identified by MRN, date of birth, ID band Patient awake    Reviewed: Allergy & Precautions, NPO status , Patient's Chart, lab work & pertinent test results  Airway Mallampati: II  TM Distance: >3 FB Neck ROM: Full    Dental  (+) Dental Advisory Given   Pulmonary neg pulmonary ROS,    breath sounds clear to auscultation       Cardiovascular negative cardio ROS   Rhythm:Regular Rate:Normal     Neuro/Psych negative neurological ROS     GI/Hepatic negative GI ROS, Neg liver ROS,   Endo/Other  negative endocrine ROS  Renal/GU negative Renal ROS     Musculoskeletal   Abdominal   Peds  Hematology negative hematology ROS (+)   Anesthesia Other Findings   Reproductive/Obstetrics                             Anesthesia Physical Anesthesia Plan  ASA: 1 and emergent  Anesthesia Plan: General   Post-op Pain Management:    Induction: Intravenous  PONV Risk Score and Plan: 2 and Dexamethasone, Ondansetron and Treatment may vary due to age or medical condition  Airway Management Planned: LMA  Additional Equipment:   Intra-op Plan:   Post-operative Plan: Extubation in OR  Informed Consent: I have reviewed the patients History and Physical, chart, labs and discussed the procedure including the risks, benefits and alternatives for the proposed anesthesia with the patient or authorized representative who has indicated his/her understanding and acceptance.     Dental advisory given  Plan Discussed with:   Anesthesia Plan Comments:        Anesthesia Quick Evaluation

## 2021-05-03 NOTE — ED Provider Notes (Signed)
I saw and evaluated the patient, reviewed the resident's note and I agree with the findings and plan.      40 year old male who presents after sustaining laceration to his right hand.  Has evidence of possible tendon injury.  Patient to be seen by hand surgery go to the OR   Lorre Nick, MD 05/03/21 [phone]

## 2021-05-03 NOTE — ED Provider Notes (Signed)
[facility] Provider Note   CSN: [id number] Arrival date & time: 05/03/21  1312     History Chief Complaint  Patient presents with   Laceration    [facility] is a 40 y.o. male.  Patient is a 40 year old male with no significant medical history that is presenting for laceration to his right hand.  Patient states that he was trimming his bushes with his electric trimmer when his hand got stuck in the electric trimmer.  He has lacerations his second through fifth digits. He complains of 8/10 pain. He says the pain is constant. Not moving his hands make it feel better. It is worst with movement. He is right handed. He is not up to date on his tetanus. He is complaining of numbness to his right point finger.      History reviewed. No pertinent past medical history.  There are no problems to display for this patient.   No past surgical history on file.     Family History  Problem Relation Age of Onset   Mental illness Father     Social History   Tobacco Use   Smoking status: Never   Smokeless tobacco: Never  Substance Use Topics   Alcohol use: Yes   Drug use: No    Home Medications Prior to Admission medications   Not on File    Allergies    Patient has no known allergies.  Review of Systems   Review of Systems  Constitutional:  Negative for chills, diaphoresis, fatigue and fever.  HENT:  Negative for congestion, dental problem, ear pain, facial swelling, hearing loss, nosebleeds, postnasal drip, rhinorrhea, sore throat and trouble swallowing.   Eyes:  Negative for photophobia, pain and visual disturbance.  Respiratory:  Negative for apnea, cough, choking, chest tightness, shortness of breath, wheezing and stridor.   Cardiovascular:  Negative for chest pain, palpitations and leg swelling.  Gastrointestinal:  Negative for abdominal distention, abdominal pain, constipation, diarrhea, nausea and vomiting.  Endocrine:  Negative for polydipsia and polyuria.  Genitourinary:  Negative for difficulty urinating, dysuria, flank pain, frequency, hematuria and urgency.  Musculoskeletal:  Negative for gait problem, myalgias, neck pain and neck stiffness.  Skin:  Negative for rash and wound.       Laceration to his four fingers  Allergic/Immunologic: Negative for environmental allergies and food allergies.  Neurological:  Negative for dizziness, tremors, seizures, syncope, facial asymmetry, speech difficulty, light-headedness, numbness and headaches.  Psychiatric/Behavioral:  Negative for behavioral problems and confusion.   All other systems reviewed and are negative.  Physical Exam Updated Vital Signs BP (!) 142/85 (BP Location: Left Arm)   Pulse 80   Temp 98.4 F (36.9 C) (Oral)   Resp 18   SpO2 97%   Physical Exam Vitals and nursing note reviewed.  Constitutional:      General: He is not in acute distress.    Appearance: Normal appearance. He is normal weight.  HENT:     Head: Normocephalic and atraumatic.     Right Ear: External ear normal.     Left Ear: External ear normal.     Nose: Nose normal. No congestion.     Mouth/Throat:     Mouth: Mucous membranes are moist.     Pharynx: Oropharynx is clear. No oropharyngeal exudate or posterior oropharyngeal erythema.  Eyes:     General: No visual field deficit.    Extraocular Movements: Extraocular movements intact.     Conjunctiva/sclera: Conjunctivae normal.  Pupils: Pupils are equal, round, and reactive to light.  Cardiovascular:     Rate and Rhythm: Normal rate and regular rhythm.     Pulses: Normal pulses.     Heart sounds: Normal heart sounds. No murmur heard.   No friction rub. No gallop.  Pulmonary:     Effort: Pulmonary effort is normal. No respiratory distress.     Breath sounds: Normal breath sounds. No stridor. No wheezing, rhonchi or rales.  Chest:     Chest wall: No tenderness.  Abdominal:     General: Abdomen is flat. Bowel  sounds are normal. There is no distension.     Palpations: Abdomen is soft.     Tenderness: There is no abdominal tenderness. There is no right CVA tenderness, left CVA tenderness, guarding or rebound.  Musculoskeletal:        General: No swelling or tenderness. Normal range of motion.     Cervical back: Normal range of motion and neck supple. No rigidity, tenderness or bony tenderness.     Thoracic back: Normal. No tenderness or bony tenderness.     Lumbar back: Normal. No tenderness or bony tenderness.     Right lower leg: No edema.     Left lower leg: No edema.     Comments: Lacerations to his right second through fifth digits. Patient is unable to flex his right pointer finger. Concern for tendon injury. Decreased sensation to right pointer finger.   Skin:    General: Skin is warm and dry.  Neurological:     General: No focal deficit present.     Mental Status: He is alert and oriented to person, place, and time. Mental status is at baseline.     Cranial Nerves: Cranial nerves are intact. No cranial nerve deficit, dysarthria or facial asymmetry.     Sensory: Sensation is intact. No sensory deficit.     Motor: Motor function is intact. No weakness.     Coordination: Coordination is intact. Finger-Nose-Finger Test normal.     Gait: Gait is intact. Gait normal.  Psychiatric:        Mood and Affect: Mood normal.        Behavior: Behavior normal.        Thought Content: Thought content normal.        Judgment: Judgment normal.    ED Results / Procedures / Treatments   Labs (all labs ordered are listed, but only abnormal results are displayed) Labs Reviewed  RESP PANEL BY RT-PCR (FLU A&B, COVID) [id number]    EKG None  Radiology DG Hand 2 View Right  Result Date: 05/03/2021 CLINICAL DATA:  Hand laceration to all 4 digits.  Head clippers. EXAM: RIGHT HAND - 2 VIEW COMPARISON:  None. FINDINGS: Lacerations are identified in the second and fifth digits. No foreign bodies identified. No  fractures. IMPRESSION: Lacerations.  No foreign bodies or fractures identified. Electronically Signed   By: Gerome Sam III M.D.   On: 05/03/2021 14:31    Procedures Procedures   Medications Ordered in ED Medications  0.9 % irrigation (POUR BTL) (1,000 mLs Irrigation Given 05/03/21 1541)  ceFAZolin (ANCEF) IVPB 2g/100 mL premix (0 g Intravenous Stopped 05/03/21 1507)  Tdap (BOOSTRIX) injection 0.5 mL (0.5 mLs Intramuscular Given 05/03/21 1424)  morphine 4 MG/ML injection 4 mg (4 mg Intravenous Given 05/03/21 1423)  0.9 %  sodium chloride infusion ( Intravenous New Bag/Given 05/03/21 1531)    ED Course  I have reviewed the triage vital signs  and the nursing notes.  Pertinent labs & imaging results that were available during my care of the patient were reviewed by me and considered in my medical decision making (see chart for details).    MDM Rules/Calculators/A&P                         [facility] is a 40 y.o. male with no significant medical history that is presenting for laceration to his right hand. Patient is hemodynamically stable and in no acute distress. He has lacerations to his second through fifth digits. He has a deformity to his right pointer finger. He is unable to flex his right pointer finger. Concerned for tendon involvement. Patient complains of decreased sensation to his right pointer finger. X-ray of his right hand showed no fracture. He was given a tetanus shot, ancef and IV pain medications. Hand surgery was consulted for evaluation. Patient will go to the OR with hand surgery to explore all wounds and repair structures.   Patient states compliance and understanding of the plan. I explained labs and imaging to the patient. No further questions at this time from the patient.  The plan for this patient was discussed with Dr. Freida Busman, who voiced agreement and who oversaw evaluation and treatment of this patient.   Final Clinical Impression(s) / ED Diagnoses Final  diagnoses:  None  Hand laceration  Rx / DC Orders ED Discharge Orders     None        Lottie Dawson, MD 05/03/21 1736    Lorre Nick, MD 05/04/21 [phone]

## 2021-05-03 NOTE — ED Triage Notes (Signed)
Patient was trimming rose bushes with hedge trimmer and has injury to right hand across all four digits at knuckles, + radial pulse, bleeding controlled, c/o numbness inde finger.

## 2021-05-03 NOTE — Transfer of Care (Signed)
Immediate Anesthesia Transfer of Care Note  Patient: [facility]  Procedure(s) Performed: IRRIGATION AND DEBRIDEMENT HAND (Right: Hand)  Patient Location: PACU  Anesthesia Type:General  Level of Consciousness: sedated  Airway & Oxygen Therapy: Patient Spontanous Breathing  Post-op Assessment: Report given to RN and Post -op Vital signs reviewed and stable  Post vital signs: Reviewed and stable  Last Vitals:  Vitals Value Taken Time  BP 105/56 05/03/21 1815  Temp 36.1 C 05/03/21 1815  Pulse 57 05/03/21 1821  Resp 13 05/03/21 1821  SpO2 94 % 05/03/21 1821  Vitals shown include unvalidated device data.  Last Pain:  Vitals:   05/03/21 1815  TempSrc:   PainSc: Asleep      Patients Stated Pain Goal: 3 (05/03/21 1641)  Complications: No notable events documented.

## 2021-05-03 NOTE — ED Notes (Signed)
Patient returned from X-ray 

## 2021-05-04 ENCOUNTER — Encounter (HOSPITAL_COMMUNITY): Payer: Self-pay | Admitting: General Surgery

## 2021-05-05 ENCOUNTER — Ambulatory Visit
Admission: RE | Admit: 2021-05-05 | Discharge: 2021-05-05 | Disposition: A | Discharge status: Home / Self Care | Payer: 59 | Source: Ambulatory Visit | Attending: Adult Health | Admitting: Adult Health

## 2021-05-05 ENCOUNTER — Other Ambulatory Visit: Payer: Self-pay

## 2021-05-05 DIAGNOSIS — M25571 Pain in right ankle and joints of right foot: Secondary | ICD-10-CM

## 2021-06-06 ENCOUNTER — Other Ambulatory Visit: Payer: Self-pay | Admitting: Adult Health

## 2021-06-06 MED ORDER — CYCLOBENZAPRINE HCL 10 MG PO TABS: 10.0000 mg | ORAL_TABLET | Freq: Three times a day (TID) | ORAL | 0 refills | Status: DC | PRN

## 2021-06-06 MED ORDER — METHYLPREDNISOLONE 4 MG PO TBPK: ORAL_TABLET | ORAL | 0 refills | Status: DC

## 2021-08-26 ENCOUNTER — Other Ambulatory Visit: Payer: Self-pay | Admitting: Adult Health

## 2021-08-26 MED ORDER — OSELTAMIVIR PHOSPHATE 75 MG PO CAPS: 75.0000 mg | ORAL_CAPSULE | Freq: Every day | ORAL | 0 refills | Status: DC

## 2021-10-04 ENCOUNTER — Telehealth: Payer: 59 | Admitting: Family

## 2021-10-04 DIAGNOSIS — Z20828 Contact with and (suspected) exposure to other viral communicable diseases: Secondary | ICD-10-CM | POA: Diagnosis not present

## 2021-10-04 MED ORDER — OSELTAMIVIR PHOSPHATE 75 MG PO CAPS: 75.0000 mg | ORAL_CAPSULE | Freq: Every day | ORAL | 0 refills | Status: DC

## 2021-10-04 NOTE — Progress Notes (Signed)
Virtual Visit Consent   DEVERICK PRUSS, you are scheduled for a virtual visit with a [facility] provider today.     Just as with appointments in the office, your consent must be obtained to participate.  Your consent will be active for this visit and any virtual visit you may have with one of our providers in the next 365 days.     If you have a MyChart account, a copy of this consent can be sent to you electronically.  All virtual visits are billed to your insurance company just like a traditional visit in the office.    As this is a virtual visit, video technology does not allow for your provider to perform a traditional examination.  This may limit your provider's ability to fully assess your condition.  If your provider identifies any concerns that need to be evaluated in person or the need to arrange testing (such as labs, EKG, etc.), we will make arrangements to do so.     Although advances in technology are sophisticated, we cannot ensure that it will always work on either your end or our end.  If the connection with a video visit is poor, the visit may have to be switched to a telephone visit.  With either a video or telephone visit, we are not always able to ensure that we have a secure connection.     I need to obtain your verbal consent now.   Are you willing to proceed with your visit today?    YERACHMIEL SPINNEY has provided verbal consent on 10/04/2021 for a virtual visit (video or telephone).   Jannifer Rodney, FNP   Date: 10/04/2021 2:01 PM   Virtual Visit via Video Note   I, Jannifer Rodney, connected with  BARNET BENAVIDES  ([id number], 01/06/1981) on 10/04/21 at  2:00 PM EST by a video-enabled telemedicine application and verified that I am speaking with the correct person using two identifiers.  Location: Patient: Virtual Visit Location Patient: Home Provider: Virtual Visit Location Provider: Home Office   I discussed the limitations of evaluation and management by  telemedicine and the availability of in person appointments. The patient expressed understanding and agreed to proceed.    History of Present Illness: [facility] is a 41 y.o. who identifies as a male who was assigned male at birth, and is being seen today for to discuss treatment for prophylactic dose of Tamiflu. He reports his son was diagnosed with flu today. Denies cough, fever, headache, or SOB.   HPI: HPI  Problems: There are no problems to display for this patient.   Allergies: No Known Allergies Medications:  Current Outpatient Medications:    oseltamivir (TAMIFLU) 75 MG capsule, Take 1 capsule (75 mg total) by mouth daily., Disp: 10 capsule, Rfl: 0  Observations/Objective: Patient is well-developed, well-nourished in no acute distress.  Resting comfortably  at home.  Head is normocephalic, atraumatic.  No labored breathing.  Speech is clear and coherent with logical content.  Patient is alert and oriented at baseline.    Assessment and Plan: 1. Exposure to influenza - oseltamivir (TAMIFLU) 75 MG capsule; Take 1 capsule (75 mg total) by mouth daily.  Dispense: 10 capsule; Refill: 0  Start Tamiflu daily. If symptoms develop, will need to increase Tamiflu to BID.  Force fluids Rest   Follow Up Instructions: I discussed the assessment and treatment plan with the patient. The patient was provided an opportunity to ask questions and all were answered.  The patient agreed with the plan and demonstrated an understanding of the instructions.  A copy of instructions were sent to the patient via MyChart unless otherwise noted below.    The patient was advised to call back or seek an in-person evaluation if the symptoms worsen or if the condition fails to improve as anticipated.  Time:  I spent 6 minutes with the patient via telehealth technology discussing the above problems/concerns.    Jannifer Rodney, FNP

## 2022-02-20 ENCOUNTER — Ambulatory Visit (HOSPITAL_COMMUNITY)
Admission: EM | Admit: 2022-02-20 | Discharge: 2022-02-20 | Disposition: A | Discharge status: Home / Self Care | Payer: 59 | Attending: Physician Assistant | Admitting: Physician Assistant

## 2022-02-20 ENCOUNTER — Ambulatory Visit (INDEPENDENT_AMBULATORY_CARE_PROVIDER_SITE_OTHER): Payer: 59

## 2022-02-20 ENCOUNTER — Encounter (HOSPITAL_COMMUNITY): Payer: Self-pay | Admitting: Emergency Medicine

## 2022-02-20 DIAGNOSIS — S92501A Displaced unspecified fracture of right lesser toe(s), initial encounter for closed fracture: Secondary | ICD-10-CM

## 2022-02-20 DIAGNOSIS — S99921A Unspecified injury of right foot, initial encounter: Secondary | ICD-10-CM

## 2022-02-20 DIAGNOSIS — S92503A Displaced unspecified fracture of unspecified lesser toe(s), initial encounter for closed fracture: Secondary | ICD-10-CM

## 2022-02-20 NOTE — ED Provider Notes (Signed)
[facility]    CSN: [id number] Arrival date & time: 02/20/22  1734      History   Chief Complaint Chief Complaint  Patient presents with   Toe Pain    HPI [facility] is a 41 y.o. male.   Patient presents today with a 2-day history of right fifth toe pain following injury.  Reports that he stopped it and has had ongoing pain since that time.  Pain is minimal at rest but increases with palpation or attempted ambulation, described as sharp, no aggravating or alleviating factors identified.  Denies previous injury or surgery.  He has not tried any over-the-counter medication for symptom management.  Reports pain is rated 1 at rest but increases to like 6/8 with palpation.  He denies any numbness, paresthesias, difficulty ambulating.    Past Medical History:  Diagnosis Date   Medical history non-contributory     There are no problems to display for this patient.   Past Surgical History:  Procedure Laterality Date   I & D EXTREMITY Right 05/03/2021   Procedure: IRRIGATION AND DEBRIDEMENT HAND;  Surgeon: Knute Neu, MD;  Location: [facility] OR;  Service: Plastics;  Laterality: Right;   NO PAST SURGERIES         Home Medications    Prior to Admission medications   Not on File    Family History Family History  Problem Relation Age of Onset   Mental illness Father     Social History Social History   Tobacco Use   Smoking status: Never   Smokeless tobacco: Never  Substance Use Topics   Alcohol use: Yes    Comment: 2x week   Drug use: No     Allergies   Patient has no known allergies.   Review of Systems Review of Systems  Constitutional:  Positive for activity change. Negative for appetite change, fatigue and fever.  Musculoskeletal:  Positive for arthralgias. Negative for gait problem, joint swelling and myalgias.  Skin:  Positive for color change. Negative for wound.  Neurological:  Negative for weakness and numbness.     Physical  Exam Triage Vital Signs ED Triage Vitals [02/20/22 1809]  Enc Vitals Group     BP 124/80     Pulse Rate 62     Resp 15     Temp 97.8 F (36.6 C)     Temp Source Oral     SpO2 98 %     Weight      Height      Head Circumference      Peak Flow      Pain Score 7     Pain Loc      Pain Edu?      Excl. in GC?    No data found.  Updated Vital Signs BP 124/80 (BP Location: Left Arm)   Pulse 62   Temp 97.8 F (36.6 C) (Oral)   Resp 15   SpO2 98%   Visual Acuity Right Eye Distance:   Left Eye Distance:   Bilateral Distance:    Right Eye Near:   Left Eye Near:    Bilateral Near:     Physical Exam Vitals reviewed.  Constitutional:      General: He is awake.     Appearance: Normal appearance. He is well-developed. He is not ill-appearing.     Comments: Very pleasant male appears stated age in no acute distress sitting comfortably in exam room  HENT:  Head: Normocephalic and atraumatic.  Cardiovascular:     Rate and Rhythm: Normal rate and regular rhythm.     Heart sounds: Normal heart sounds, S1 normal and S2 normal. No murmur heard.    Comments: Capillary refill within 2 seconds right toes Pulmonary:     Effort: Pulmonary effort is normal.     Breath sounds: Normal breath sounds. No stridor. No wheezing, rhonchi or rales.     Comments: Clear auscultation bilaterally Abdominal:     General: Bowel sounds are normal.     Palpations: Abdomen is soft.     Tenderness: There is no abdominal tenderness.  Musculoskeletal:     Right foot: Normal range of motion. No deformity.       Feet:  Feet:     Right foot:     Protective Sensation: 10 sites tested.  10 sites sensed.     Skin integrity: No ulcer, blister or skin breakdown.     Toenail Condition: Right toenails are normal.     Comments: Foot neurovascularly intact. Neurological:     Mental Status: He is alert.  Psychiatric:        Behavior: Behavior is cooperative.      UC Treatments / Results  Labs (all  labs ordered are listed, but only abnormal results are displayed) Labs Reviewed - No data to display  EKG   Radiology DG Toe 5th Right  Result Date: 02/20/2022 CLINICAL DATA:  Trauma, pain EXAM: RIGHT FIFTH TOE COMPARISON:  None Available. FINDINGS: There is oblique essentially undisplaced fracture in the shaft of proximal phalanx of right fifth toe. There is fusion of middle and distal phalanges. IMPRESSION: Essentially undisplaced fracture is seen in the shaft of proximal phalanx of right fifth toe. Electronically Signed   By: Ernie Avena M.D.   On: 02/20/2022 18:58    Procedures Procedures (including critical care time)  Medications Ordered in UC Medications - No data to display  Initial Impression / Assessment and Plan / UC Course  I have reviewed the triage vital signs and the nursing notes.  Pertinent labs & imaging results that were available during my care of the patient were reviewed by me and considered in my medical decision making (see chart for details).     X-ray obtained given mechanism of injury showed nondisplaced fracture of proximal phalanx.  Patient reports minimal pain at rest so recommended over-the-counter medications including Tylenol and ibuprofen for pain relief.  He was placed in buddy tape and postop shoe for comfort.  Discussed that this will typically heal on its own but if he has persistent or worsening symptoms he should follow-up with podiatry and was given contact information for local provider.  Recommended RICE protocol.  Discussed alarm symptoms that warrant emergent evaluation.  Strict return precautions given.  Final Clinical Impressions(s) / UC Diagnoses   Final diagnoses:  Closed fracture of phalanx of fifth toe  Injury of toe on right foot, initial encounter     Discharge Instructions      You do have a fracture.  Please continue buddy taping and using the postop shoe for comfort.  If your symptoms are proving quickly please  follow-up with podiatry.  Call to schedule an appointment.  Alternate Tylenol and ibuprofen for pain.  If you have any worsening symptoms including numbness, tingling, increased pain you need to be seen immediately.     ED Prescriptions   None    PDMP not reviewed this encounter.   Vinod Mikesell, Denny Peon  K, PA-C 02/20/22 1907

## 2022-02-20 NOTE — Discharge Instructions (Signed)
You do have a fracture.  Please continue buddy taping and using the postop shoe for comfort.  If your symptoms are proving quickly please follow-up with podiatry.  Call to schedule an appointment.  Alternate Tylenol and ibuprofen for pain.  If you have any worsening symptoms including numbness, tingling, increased pain you need to be seen immediately.

## 2022-02-20 NOTE — ED Triage Notes (Signed)
Pt reports that hit right 5th toe couple days ago and having pain and swelling.

## 2022-05-19 ENCOUNTER — Encounter: Payer: Self-pay | Admitting: Adult Health

## 2022-05-19 ENCOUNTER — Ambulatory Visit (INDEPENDENT_AMBULATORY_CARE_PROVIDER_SITE_OTHER): Payer: 59 | Admitting: Adult Health

## 2022-05-19 VITALS — BP 120/80 | HR 85 | Temp 98.1°F | Ht 71.0 in | Wt 216.0 lb

## 2022-05-19 DIAGNOSIS — J40 Bronchitis, not specified as acute or chronic: Secondary | ICD-10-CM | POA: Diagnosis not present

## 2022-05-19 MED ORDER — PREDNISONE 10 MG PO TABS: ORAL_TABLET | ORAL | 0 refills | Status: DC

## 2022-05-19 MED ORDER — ALBUTEROL SULFATE HFA 108 (90 BASE) MCG/ACT IN AERS: 2.0000 | INHALATION_SPRAY | Freq: Four times a day (QID) | RESPIRATORY_TRACT | 0 refills | Status: DC | PRN

## 2022-05-19 MED ORDER — HYDROCODONE BIT-HOMATROP MBR 5-1.5 MG/5ML PO SOLN: 5.0000 mL | Freq: Three times a day (TID) | ORAL | 0 refills | Status: DC | PRN

## 2022-05-19 NOTE — Patient Instructions (Signed)
It was great seeing you today   It sounds like you have bronchitis.   I have prescribed an albuterol inhaler, oral steroids, and cough syrup   Let me know if this is not resolved in the next week

## 2022-05-19 NOTE — Progress Notes (Signed)
Subjective:    Patient ID: [facility], male    DOB: Mar 03, 1981, 41 y.o.   MRN: [id number]  HPI 41 year old male who  has a past medical history of Medical history non-contributory.  He presents to the office today for an acute issue.  He reports that 5 to 6 weeks ago he went out to dinner with a friend who ended up testing positive for COVID-19.  He developed a high fever, coughing, and body aches, he did due to at home COVID test but both were negative.  Since then the fever has resolved and he is left with a nonproductive cough, wheezing, and shortness of breath with activity.  He does not feel acutely ill, has not had any fevers or chills.  Cough is getting better to some degree.  At home he has been using Robitussin, DayQuil, and cough lozenges with varying success  Review of Systems See HPI   Past Medical History:  Diagnosis Date   Medical history non-contributory     Social History   Socioeconomic History   Marital status: Married    Spouse name: Not on file   Number of children: Not on file   Years of education: Not on file   Highest education level: Not on file  Occupational History   Not on file  Tobacco Use   Smoking status: Never   Smokeless tobacco: Never  Substance and Sexual Activity   Alcohol use: Yes    Comment: 2x week   Drug use: No   Sexual activity: Not on file  Other Topics Concern   Not on file  Social History Narrative   Not on file   Social Determinants of Health   Financial Resource Strain: Not on file  Food Insecurity: Not on file  Transportation Needs: Not on file  Physical Activity: Not on file  Stress: Not on file  Social Connections: Not on file  Intimate Partner Violence: Not on file    Past Surgical History:  Procedure Laterality Date   I & D EXTREMITY Right 05/03/2021   Procedure: IRRIGATION AND DEBRIDEMENT HAND;  Surgeon: Knute Neu, MD;  Location: [facility];  Service: Plastics;  Laterality: Right;   NO PAST SURGERIES       Family History  Problem Relation Age of Onset   Mental illness Father     No Known Allergies  No current outpatient medications on file prior to visit.   No current facility-administered medications on file prior to visit.    BP 120/80   Pulse 85   Temp 98.1 F (36.7 C) (Oral)   Ht 5\' 11"  (1.803 m)   Wt 216 lb (98 kg)   SpO2 94%   BMI 30.13 kg/m       Objective:   Physical Exam Vitals and nursing note reviewed.  Constitutional:      Appearance: Normal appearance.  Cardiovascular:     Rate and Rhythm: Normal rate and regular rhythm.     Pulses: Normal pulses.     Heart sounds: Normal heart sounds.  Pulmonary:     Effort: Pulmonary effort is normal. No respiratory distress.     Breath sounds: No stridor. Wheezing (expiratory wheezing throughout) present. No rhonchi or rales.  Skin:    General: Skin is warm and dry.     Capillary Refill: Capillary refill takes less than 2 seconds.  Neurological:     General: No focal deficit present.     Mental Status: He is  alert and oriented to person, place, and time.  Psychiatric:        Mood and Affect: Mood normal.        Behavior: Behavior normal.        Thought Content: Thought content normal.        Judgment: Judgment normal.       Assessment & Plan:  1. Bronchitis  - predniSONE (DELTASONE) 10 MG tablet; 40 mg x 3 days, 20 mg x 3 days, 10 mg x 3 days  Dispense: 21 tablet; Refill: 0 - albuterol (VENTOLIN HFA) 108 (90 Base) MCG/ACT inhaler; Inhale 2 puffs into the lungs every 6 (six) hours as needed for wheezing or shortness of breath.  Dispense: 8 g; Refill: 0 - HYDROcodone bit-homatropine (HYCODAN) 5-1.5 MG/5ML syrup; Take 5 mLs by mouth every 8 (eight) hours as needed for cough.  Dispense: 120 mL; Refill: 0  Shirline Frees, NP

## 2022-05-27 ENCOUNTER — Other Ambulatory Visit: Payer: Self-pay | Admitting: Adult Health

## 2022-05-27 DIAGNOSIS — J40 Bronchitis, not specified as acute or chronic: Secondary | ICD-10-CM

## 2022-05-27 MED ORDER — PREDNISONE 10 MG PO TABS: ORAL_TABLET | ORAL | 0 refills | Status: DC

## 2022-05-27 MED ORDER — ALBUTEROL SULFATE HFA 108 (90 BASE) MCG/ACT IN AERS: 2.0000 | INHALATION_SPRAY | Freq: Four times a day (QID) | RESPIRATORY_TRACT | 0 refills | Status: DC | PRN

## 2022-05-27 NOTE — Telephone Encounter (Signed)
Okay for refill?  

## 2022-06-13 ENCOUNTER — Other Ambulatory Visit: Payer: Self-pay | Admitting: Adult Health

## 2022-06-13 DIAGNOSIS — J40 Bronchitis, not specified as acute or chronic: Secondary | ICD-10-CM

## 2022-07-08 ENCOUNTER — Other Ambulatory Visit: Payer: Self-pay | Admitting: Adult Health

## 2022-07-08 DIAGNOSIS — J40 Bronchitis, not specified as acute or chronic: Secondary | ICD-10-CM

## 2022-12-22 ENCOUNTER — Telehealth: Payer: 59 | Admitting: Physician Assistant

## 2022-12-22 DIAGNOSIS — U071 COVID-19: Secondary | ICD-10-CM | POA: Diagnosis not present

## 2022-12-22 MED ORDER — NIRMATRELVIR/RITONAVIR (PAXLOVID)TABLET: 3.0000 | ORAL_TABLET | Freq: Two times a day (BID) | ORAL | 0 refills | Status: AC

## 2022-12-22 NOTE — Patient Instructions (Signed)
Eric Mack, thank you for joining Margaretann Loveless, PA-C for today's virtual visit.  While this provider is not your primary care provider (PCP), if your PCP is located in our provider database this encounter information will be shared with them immediately following your visit.   A [place] MyChart account gives you access to today's visit and all your visits, tests, and labs performed at [facility] " click here if you don't have a  MyChart account or go to [email or web address]  Consent: (Patient) Eric Mack provided verbal consent for this virtual visit at the beginning of the encounter.  Current Medications:  Current Outpatient Medications:    albuterol (VENTOLIN HFA) 108 (90 Base) MCG/ACT inhaler, TAKE 2 PUFFS BY MOUTH EVERY 6 HOURS AS NEEDED FOR WHEEZE OR SHORTNESS OF BREATH, Disp: 8.5 each, Rfl: 1   HYDROcodone bit-homatropine (HYCODAN) 5-1.5 MG/5ML syrup, Take 5 mLs by mouth every 8 (eight) hours as needed for cough., Disp: 120 mL, Rfl: 0   nirmatrelvir/ritonavir (PAXLOVID) 20 x 150 MG & 10 x  TABS, Take 3 tablets by mouth 2 (two) times daily for 5 days. (Take nirmatrelvir 150 mg two tablets twice daily for 5 days and ritonavir 100 mg one tablet twice daily for 5 days) Patient GFR is 106, Disp: 30 tablet, Rfl: 0   predniSONE (DELTASONE) 10 MG tablet, 40 mg x 3 days, 20 mg x 3 days, 10 mg x 3 days, Disp: 21 tablet, Rfl: 0   Medications ordered in this encounter:  Meds ordered this encounter  Medications   nirmatrelvir/ritonavir (PAXLOVID) 20 x 150 MG & 10 x  TABS    Sig: Take 3 tablets by mouth 2 (two) times daily for 5 days. (Take nirmatrelvir 150 mg two tablets twice daily for 5 days and ritonavir 100 mg one tablet twice daily for 5 days) Patient GFR is 106    Dispense:  30 tablet    Refill:  0    Order Specific Question:   Supervising Provider    Answer:   Merrilee Jansky [id number]     *If you need refills on other  medications prior to your next appointment, please contact your pharmacy*  Follow-Up: Call back or seek an in-person evaluation if the symptoms worsen or if the condition fails to improve as anticipated.  [facility] [phone]  Care Instructions:  Nirmatrelvir; Ritonavir Tablets What is this medication? NIRMATRELVIR; RITONAVIR (NIR ma TREL vir; ri TOE na veer) treats mild to moderate COVID-19. It may help people who are at high risk of developing severe illness. It works by limiting the spread of the virus in your body. This medicine may be used for other purposes; ask your health care provider or pharmacist if you have questions. COMMON BRAND NAME(S): PAXLOVID What should I tell my care team before I take this medication? They need to know if you have any of these conditions: Any allergies Any serious illness Kidney disease Liver disease An unusual or allergic reaction to nirmatrelvir, ritonavir, other medications, foods, dyes, or preservatives Pregnant or trying to get pregnant Breast-feeding How should I use this medication? This product contains 2 different medications that are packaged together. For the standard dose, take 2 pink tablets of nirmatrelvir with 1 white tablet of ritonavir (3 tablets total) by mouth with water twice daily. Talk to your care team if you have kidney disease. You may need a different dose. Swallow the tablets whole. You can take it with or  without food. If it upsets your stomach, take it with food. Take all of this medication unless your care team tells you to stop it early. Keep taking it even if you think you are better. Talk to your care team about the use of this medication in children. While it may be prescribed for children as young as 12 years for selected conditions, precautions do apply. Overdosage: If you think you have taken too much of this medicine contact a poison control center or emergency room at once. NOTE: This medicine  is only for you. Do not share this medicine with others. What if I miss a dose? If you miss a dose, take it as soon as you can unless it is more than 8 hours late. If it is more than 8 hours late, skip the missed dose. Take the next dose at the normal time. Do not take extra or 2 doses at the same time to make up for the missed dose. What may interact with this medication? Do not take this medication with any of the following medications: Alfuzosin Certain medications for anxiety or sleep like midazolam, triazolam Certain medications for cancer like apalutamide, enzalutamide Certain medications for cholesterol like lovastatin, simvastatin Certain medications for irregular heart beat like amiodarone, dronedarone, flecainide, propafenone, quinidine Certain medications for pain like meperidine, piroxicam Certain medications for psychotic disorders like clozapine, lurasidone, pimozide Certain medications for seizures like carbamazepine, phenobarbital, phenytoin Colchicine Eletriptan Eplerenone Ergot alkaloids like dihydroergotamine, ergonovine, ergotamine, methylergonovine Finerenone Flibanserin Ivabradine Lomitapide Naloxegol Ranolazine Rifampin Sildenafil Silodosin St. John's Wort Tolvaptan Ubrogepant Voclosporin This medication may also interact with the following medications: Bedaquiline Birth control pills Bosentan Certain antibiotics like erythromycin or clarithromycin Certain medications for blood pressure like amlodipine, diltiazem, felodipine, nicardipine, nifedipine Certain medications for cancer like abemaciclib, ceritinib, dasatinib, encorafenib, ibrutinib, ivosidenib, neratinib, nilotinib, venetoclax, vinblastine, vincristine Certain medications for cholesterol like atorvastatin, rosuvastatin Certain medications for depression like bupropion, trazodone Certain medications for fungal infections like isavuconazonium, itraconazole, ketoconazole, voriconazole Certain  medications for hepatitis C like elbasvir; grazoprevir, dasabuvir; ombitasvir; paritaprevir; ritonavir, glecaprevir; pibrentasvir, sofosbuvir; velpatasvir; voxilaprevir Certain medications for HIV or AIDS Certain medications for irregular heartbeat like lidocaine Certain medications that treat or prevent blood clots like rivaroxaban, warfarin Digoxin Fentanyl Medications that lower your chance of fighting infection like cyclosporine, sirolimus, tacrolimus Methadone Quetiapine Rifabutin Salmeterol Steroid medications like betamethasone, budesonide, ciclesonide, dexamethasone, fluticasone, methylprednisolone, mometasone, triamcinolone This list may not describe all possible interactions. Give your health care provider a list of all the medicines, herbs, non-prescription drugs, or dietary supplements you use. Also tell them if you smoke, drink alcohol, or use illegal drugs. Some items may interact with your medicine. What should I watch for while using this medication? Your condition will be monitored carefully while you are receiving this medication. Visit your care team for regular checkups. Tell your care team if your symptoms do not start to get better or if they get worse. If you have untreated HIV infection, this medication may lead to some HIV medications not working as well in the future. Estrogen and progestin hormones may not work as well while you are taking this medication. Your care team can help you find the contraceptive option that works for you. What side effects may I notice from receiving this medication? Side effects that you should report to your care team as soon as possible: Allergic reactions--skin rash, itching, hives, swelling of the face, lips, tongue, or throat Liver injury--right upper belly pain, loss of appetite, nausea,  light-colored stool, dark yellow or brown urine, yellowing skin or eyes, unusual weakness or fatigue Redness, blistering, peeling, or loosening of  the skin, including inside the mouth Side effects that usually do not require medical attention (report these to your care team if they continue or are bothersome): Change in taste Diarrhea General discomfort and fatigue Increase in blood pressure Muscle pain Nausea Stomach pain This list may not describe all possible side effects. Call your doctor for medical advice about side effects. You may report side effects to FDA at 1-800-FDA-1088. Where should I keep my medication? Keep out of the reach of children and pets. Store at room temperature between 20 and 25 degrees C (68 and 77 degrees F). Get rid of any unused medication after the expiration date. To get rid of medications that are no longer needed or have expired: Take the medication to a medication take-back program. Check with your pharmacy or law enforcement to find a location. If you cannot return the medication, check the label or package insert to see if the medication should be thrown out in the garbage or flushed down the toilet. If you are not sure, ask your care team. If it is safe to put it in the trash, take the medication out of the container. Mix the medication with cat litter, dirt, coffee grounds, or other unwanted substance. Seal the mixture in a bag or container. Put it in the trash. NOTE: This sheet is a summary. It may not cover all possible information. If you have questions about this medicine, talk to your doctor, pharmacist, or health care provider.  2023 Elsevier/Gold Standard (2020-09-02 00:00:00)    Isolation Instructions: You are to isolate at home until you have been fever free for at least 24 hours without a fever-reducing medication, and symptoms have been steadily improving for 24 hours. At that time,  you can end isolation but need to mask for an additional 5 days.   If you must be around other household members who do not have symptoms, you need to make sure that both you and the family members are  masking consistently with a high-quality mask.  If you note any worsening of symptoms despite treatment, please seek an in-person evaluation ASAP. If you note any significant shortness of breath or any chest pain, please seek ER evaluation. Please do not delay care!   COVID-19: What to Do if You Are Sick If you test positive and are an older adult or someone who is at high risk of getting very sick from COVID-19, treatment may be available. Contact a healthcare provider right away after a positive test to determine if you are eligible, even if your symptoms are mild right now. You can also visit a Test to Treat location and, if eligible, receive a prescription from a provider. Don't delay: Treatment must be started within the first few days to be effective. If you have a fever, cough, or other symptoms, you might have COVID-19. Most people have mild illness and are able to recover at home. If you are sick: Keep track of your symptoms. If you have an emergency warning sign (including trouble breathing), call 911. Steps to help prevent the spread of COVID-19 if you are sick If you are sick with COVID-19 or think you might have COVID-19, follow the steps below to care for yourself and to help protect other people in your home and community. Stay home except to get medical care Stay home. Most people with COVID-19 have  mild illness and can recover at home without medical care. Do not leave your home, except to get medical care. Do not visit public areas and do not go to places where you are unable to wear a mask. Take care of yourself. Get rest and stay hydrated. Take over-the-counter medicines, such as acetaminophen, to help you feel better. Stay in touch with your doctor. Call before you get medical care. Be sure to get care if you have trouble breathing, or have any other emergency warning signs, or if you think it is an emergency. Avoid public transportation, ride-sharing, or taxis if possible. Get  tested If you have symptoms of COVID-19, get tested. While waiting for test results, stay away from others, including staying apart from those living in your household. Get tested as soon as possible after your symptoms start. Treatments may be available for people with COVID-19 who are at risk for becoming very sick. Don't delay: Treatment must be started early to be effective--some treatments must begin within 5 days of your first symptoms. Contact your healthcare provider right away if your test result is positive to determine if you are eligible. Self-tests are one of several options for testing for the virus that causes COVID-19 and may be more convenient than laboratory-based tests and point-of-care tests. Ask your healthcare provider or your local health department if you need help interpreting your test results. You can visit your state, tribal, local, and territorial health department's website to look for the latest local information on testing sites. Separate yourself from other people As much as possible, stay in a specific room and away from other people and pets in your home. If possible, you should use a separate bathroom. If you need to be around other people or animals in or outside of the home, wear a well-fitting mask. Tell your close contacts that they may have been exposed to COVID-19. An infected person can spread COVID-19 starting 48 hours (or 2 days) before the person has any symptoms or tests positive. By letting your close contacts know they may have been exposed to COVID-19, you are helping to protect everyone. See COVID-19 and Animals if you have questions about pets. If you are diagnosed with COVID-19, someone from the health department may call you. Answer the call to slow the spread. Monitor your symptoms Symptoms of COVID-19 include fever, cough, or other symptoms. Follow care instructions from your healthcare provider and local health department. Your local health  authorities may give instructions on checking your symptoms and reporting information. When to seek emergency medical attention Look for emergency warning signs* for COVID-19. If someone is showing any of these signs, seek emergency medical care immediately: Trouble breathing Persistent pain or pressure in the chest New confusion Inability to wake or stay awake Pale, gray, or blue-colored skin, lips, or nail beds, depending on skin tone *This list is not all possible symptoms. Please call your medical provider for any other symptoms that are severe or concerning to you. Call 911 or call ahead to your local emergency facility: Notify the operator that you are seeking care for someone who has or may have COVID-19. Call ahead before visiting your doctor Call ahead. Many medical visits for routine care are being postponed or done by phone or telemedicine. If you have a medical appointment that cannot be postponed, call your doctor's office, and tell them you have or may have COVID-19. This will help the office protect themselves and other patients. If you are sick, wear  a well-fitting mask You should wear a mask if you must be around other people or animals, including pets (even at home). Wear a mask with the best fit, protection, and comfort for you. You don't need to wear the mask if you are alone. If you can't put on a mask (because of trouble breathing, for example), cover your coughs and sneezes in some other way. Try to stay at least 6 feet away from other people. This will help protect the people around you. Masks should not be placed on young children under age 48 years, anyone who has trouble breathing, or anyone who is not able to remove the mask without help. Cover your coughs and sneezes Cover your mouth and nose with a tissue when you cough or sneeze. Throw away used tissues in a lined trash can. Immediately wash your hands with soap and water for at least 20 seconds. If soap and water  are not available, clean your hands with an alcohol-based hand sanitizer that contains at least 60% alcohol. Clean your hands often Wash your hands often with soap and water for at least 20 seconds. This is especially important after blowing your nose, coughing, or sneezing; going to the bathroom; and before eating or preparing food. Use hand sanitizer if soap and water are not available. Use an alcohol-based hand sanitizer with at least 60% alcohol, covering all surfaces of your hands and rubbing them together until they feel dry. Soap and water are the best option, especially if hands are visibly dirty. Avoid touching your eyes, nose, and mouth with unwashed hands. Handwashing Tips Avoid sharing personal household items Do not share dishes, drinking glasses, cups, eating utensils, towels, or bedding with other people in your home. Wash these items thoroughly after using them with soap and water or put in the dishwasher. Clean surfaces in your home regularly Clean and disinfect high-touch surfaces (for example, doorknobs, tables, handles, light switches, and countertops) in your "sick room" and bathroom. In shared spaces, you should clean and disinfect surfaces and items after each use by the person who is ill. If you are sick and cannot clean, a caregiver or other person should only clean and disinfect the area around you (such as your bedroom and bathroom) on an as needed basis. Your caregiver/other person should wait as long as possible (at least several hours) and wear a mask before entering, cleaning, and disinfecting shared spaces that you use. Clean and disinfect areas that may have blood, stool, or body fluids on them. Use household cleaners and disinfectants. Clean visible dirty surfaces with household cleaners containing soap or detergent. Then, use a household disinfectant. Use a product from [employer] List N: Disinfectants for Coronavirus (COVID-19). Be sure to follow the instructions on the  label to ensure safe and effective use of the product. Many products recommend keeping the surface wet with a disinfectant for a certain period of time (look at "contact time" on the product label). You may also need to wear personal protective equipment, such as gloves, depending on the directions on the product label. Immediately after disinfecting, wash your hands with soap and water for 20 seconds. For completed guidance on cleaning and disinfecting your home, visit Complete Disinfection Guidance. Take steps to improve ventilation at home Improve ventilation (air flow) at home to help prevent from spreading COVID-19 to other people in your household. Clear out COVID-19 virus particles in the air by opening windows, using air filters, and turning on fans in your home. Use  this interactive tool to learn how to improve air flow in your home. When you can be around others after being sick with COVID-19 Deciding when you can be around others is different for different situations. Find out when you can safely end home isolation. For any additional questions about your care, contact your healthcare provider or state or local health department. 11/26/2020 Content source: [facility] for Immunization and Respiratory Diseases ([facility]), Division of Viral Diseases This information is not intended to replace advice given to you by your health care provider. Make sure you discuss any questions you have with your health care provider. Document Revised: 01/09/2021 Document Reviewed: 01/09/2021 Elsevier Patient Education  2022 [employer].     If you have been instructed to have an in-person evaluation today at a local Urgent Care facility, please use the link below. It will take you to a list of all of our available [facility], including address, phone number and hours of operation. Please do not delay care.  [facility]  If you or a family member do not have a primary care  provider, use the link below to schedule a visit and establish care. When you choose a [facility] primary care physician or advanced practice provider, you gain a long-term partner in health. Find a Primary Care Provider  Learn more about [facility]'s in-office and virtual care options: [facility] - Get Care Now

## 2022-12-22 NOTE — Progress Notes (Signed)
Virtual Visit Consent   Eric Mack, you are scheduled for a virtual visit with a [facility] provider today. Just as with appointments in the office, your consent must be obtained to participate. Your consent will be active for this visit and any virtual visit you may have with one of our providers in the next 365 days. If you have a MyChart account, a copy of this consent can be sent to you electronically.  As this is a virtual visit, video technology does not allow for your provider to perform a traditional examination. This may limit your provider's ability to fully assess your condition. If your provider identifies any concerns that need to be evaluated in person or the need to arrange testing (such as labs, EKG, etc.), we will make arrangements to do so. Although advances in technology are sophisticated, we cannot ensure that it will always work on either your end or our end. If the connection with a video visit is poor, the visit may have to be switched to a telephone visit. With either a video or telephone visit, we are not always able to ensure that we have a secure connection.  By engaging in this virtual visit, you consent to the provision of healthcare and authorize for your insurance to be billed (if applicable) for the services provided during this visit. Depending on your insurance coverage, you may receive a charge related to this service.  I need to obtain your verbal consent now. Are you willing to proceed with your visit today? Eric Mack has provided verbal consent on 12/22/2022 for a virtual visit (video or telephone). Margaretann Loveless, PA-C  Date: 12/22/2022 8:13 AM  Virtual Visit via Video Note   I, Margaretann Loveless, connected with  Eric Mack  ([id number], September 05, 1981) on 12/22/22 at  8:00 AM EDT by a video-enabled telemedicine application and verified that I am speaking with the correct person using two identifiers.  Location: Patient: Virtual Visit Location  Patient: Home Provider: Virtual Visit Location Provider: Home Office   I discussed the limitations of evaluation and management by telemedicine and the availability of in person appointments. The patient expressed understanding and agreed to proceed.    History of Present Illness: Eric Mack is a 42 y.o. who identifies as a male who was assigned male at birth, and is being seen today for Covid 28.  HPI: URI  This is a new problem. The current episode started in the past 7 days (Tested positive for Covid 19 yesterday; Symptoms started Sunday). The problem has been gradually worsening. There has been no fever. Associated symptoms include congestion, coughing, headaches, rhinorrhea and sinus pain. Pertinent negatives include no diarrhea, ear pain, nausea, plugged ear sensation, sore throat, vomiting or wheezing. Associated symptoms comments: myalgias. Treatments tried: nyquil. The treatment provided no relief.     Problems: There are no problems to display for this patient.   Allergies: No Known Allergies Medications:  Current Outpatient Medications:    albuterol (VENTOLIN HFA) 108 (90 Base) MCG/ACT inhaler, TAKE 2 PUFFS BY MOUTH EVERY 6 HOURS AS NEEDED FOR WHEEZE OR SHORTNESS OF BREATH, Disp: 8.5 each, Rfl: 1   HYDROcodone bit-homatropine (HYCODAN) 5-1.5 MG/5ML syrup, Take 5 mLs by mouth every 8 (eight) hours as needed for cough., Disp: 120 mL, Rfl: 0   nirmatrelvir/ritonavir (PAXLOVID) 20 x 150 MG & 10 x  TABS, Take 3 tablets by mouth 2 (two) times daily for 5 days. (Take nirmatrelvir 150 mg two tablets  twice daily for 5 days and ritonavir 100 mg one tablet twice daily for 5 days) Patient GFR is 106, Disp: 30 tablet, Rfl: 0   predniSONE (DELTASONE) 10 MG tablet, 40 mg x 3 days, 20 mg x 3 days, 10 mg x 3 days, Disp: 21 tablet, Rfl: 0  Observations/Objective: Patient is well-developed, well-nourished in no acute distress.  Resting comfortably at home.  Head is normocephalic,  atraumatic.  No labored breathing.  Speech is clear and coherent with logical content.  Patient is alert and oriented at baseline.    Assessment and Plan: 1. COVID-19 - nirmatrelvir/ritonavir (PAXLOVID) 20 x 150 MG & 10 x  TABS; Take 3 tablets by mouth 2 (two) times daily for 5 days. (Take nirmatrelvir 150 mg two tablets twice daily for 5 days and ritonavir 100 mg one tablet twice daily for 5 days) Patient GFR is 106  Dispense: 30 tablet; Refill: 0  - Continue OTC symptomatic management of choice - Will send OTC vitamins and supplement information through AVS - Paxlovid prescribed; renal ADR discussed, patient willing to proceed despite not having labs recently - Patient enrolled in MyChart symptom monitoring - Push fluids - Rest as needed - Discussed return precautions and when to seek in-person evaluation, sent via AVS as well   Follow Up Instructions: I discussed the assessment and treatment plan with the patient. The patient was provided an opportunity to ask questions and all were answered. The patient agreed with the plan and demonstrated an understanding of the instructions.  A copy of instructions were sent to the patient via MyChart unless otherwise noted below.    The patient was advised to call back or seek an in-person evaluation if the symptoms worsen or if the condition fails to improve as anticipated.  Time:  I spent 12 minutes with the patient via telehealth technology discussing the above problems/concerns.    Margaretann Loveless, PA-C

## 2023-01-14 ENCOUNTER — Ambulatory Visit (INDEPENDENT_AMBULATORY_CARE_PROVIDER_SITE_OTHER): Payer: 59 | Admitting: Adult Health

## 2023-01-14 VITALS — BP 120/88 | HR 70 | Temp 98.0°F | Ht 71.0 in | Wt 218.0 lb

## 2023-01-14 DIAGNOSIS — M545 Low back pain, unspecified: Secondary | ICD-10-CM | POA: Diagnosis not present

## 2023-01-14 MED ORDER — CYCLOBENZAPRINE HCL 10 MG PO TABS
10.0000 mg | ORAL_TABLET | Freq: Every day | ORAL | 0 refills | Status: DC
Start: 2023-01-14 — End: 2023-12-04

## 2023-01-14 MED ORDER — METHYLPREDNISOLONE 4 MG PO TBPK
ORAL_TABLET | ORAL | 0 refills | Status: DC
Start: 2023-01-14 — End: 2023-04-05

## 2023-01-14 NOTE — Progress Notes (Signed)
Subjective:    Patient ID: Eric Mack, male    DOB: [birth year], 42 y.o.   MRN: [id number]  Back Pain   42 year old male who  has a past medical history of Medical history non-contributory.  He is being evaluated today for an acute issue.  Reports that about 5 days ago he started to experience pain in his lower back.  Believes that he was lifting too much during a workout and strained a muscle.  Over the last 5 days he has been using heat and massage with some relief.  Discomfort is worse in the morning when he wakes up felt as an aching tight stiff discomfort.  He has a day goes on and improves to some degree but still has pain with twisting bending, turning motions.  Has no pain that radiates down the outside of his legs.  No issues with bowel or bladder.    Review of Systems  Musculoskeletal:  Positive for back pain.    Past Medical History:  Diagnosis Date   Medical history non-contributory     Social History   Socioeconomic History   Marital status: Married    Spouse name: Not on file   Number of children: Not on file   Years of education: Not on file   Highest education level: Not on file  Occupational History   Not on file  Tobacco Use   Smoking status: Never   Smokeless tobacco: Never  Substance and Sexual Activity   Alcohol use: Yes    Comment: 2x week   Drug use: No   Sexual activity: Not on file  Other Topics Concern   Not on file  Social History Narrative   Not on file   Social Determinants of Health   Financial Resource Strain: Not on file  Food Insecurity: Not on file  Transportation Needs: Not on file  Physical Activity: Not on file  Stress: Not on file  Social Connections: Not on file  Intimate Partner Violence: Not on file    Past Surgical History:  Procedure Laterality Date   I & D EXTREMITY Right 05/03/2021   Procedure: IRRIGATION AND DEBRIDEMENT HAND;  Surgeon: Knute Neu, MD;  Location: [facility] OR;  Service: Plastics;  Laterality:  Right;   NO PAST SURGERIES      Family History  Problem Relation Age of Onset   Mental illness Father     No Known Allergies  No current outpatient medications on file prior to visit.   No current facility-administered medications on file prior to visit.    BP 120/88   Pulse 70   Temp 98 F (36.7 C) (Oral)   Ht 5\' 11"  (1.803 m)   Wt 218 lb (98.9 kg)   SpO2 99%   BMI 30.40 kg/m       Objective:   Physical Exam Vitals and nursing note reviewed.  Constitutional:      Appearance: Normal appearance.  Cardiovascular:     Rate and Rhythm: Normal rate and regular rhythm.     Pulses: Normal pulses.     Heart sounds: Normal heart sounds.  Pulmonary:     Effort: Pulmonary effort is normal.     Breath sounds: Normal breath sounds.  Musculoskeletal:        General: Normal range of motion.     Lumbar back: Spasms and tenderness present. No bony tenderness. Normal range of motion.       Back:  Skin:    General:  Skin is warm and dry.  Neurological:     General: No focal deficit present.     Mental Status: He is alert and oriented to person, place, and time.  Psychiatric:        Mood and Affect: Mood normal.        Behavior: Behavior normal.        Thought Content: Thought content normal.        Judgment: Judgment normal.       Assessment & Plan:  1. Acute midline low back pain without sciatica - Appears to be muscle strain.  - Will try Medrol Dosepak and Flexeril.  Advised to take Flexeril in the evening in case it makes him feel sedated.  Continue to use heating pad.  Follow-up if no improvement over the next week - methylPREDNISolone (MEDROL DOSEPAK) 4 MG TBPK tablet; Take as directed  Dispense: 21 tablet; Refill: 0 - cyclobenzaprine (FLEXERIL) 10 MG tablet; Take 1 tablet (10 mg total) by mouth at bedtime.  Dispense: 30 tablet; Refill: 0   Shirline Frees, NP

## 2023-04-04 IMAGING — DX DG TOE 5TH 2+V*R*
3 series · 3 of 3 positions shown · non-contrast
Comparison: None Available.

CLINICAL DATA: Trauma, pain

EXAM:
RIGHT FIFTH TOE

[toe ap]
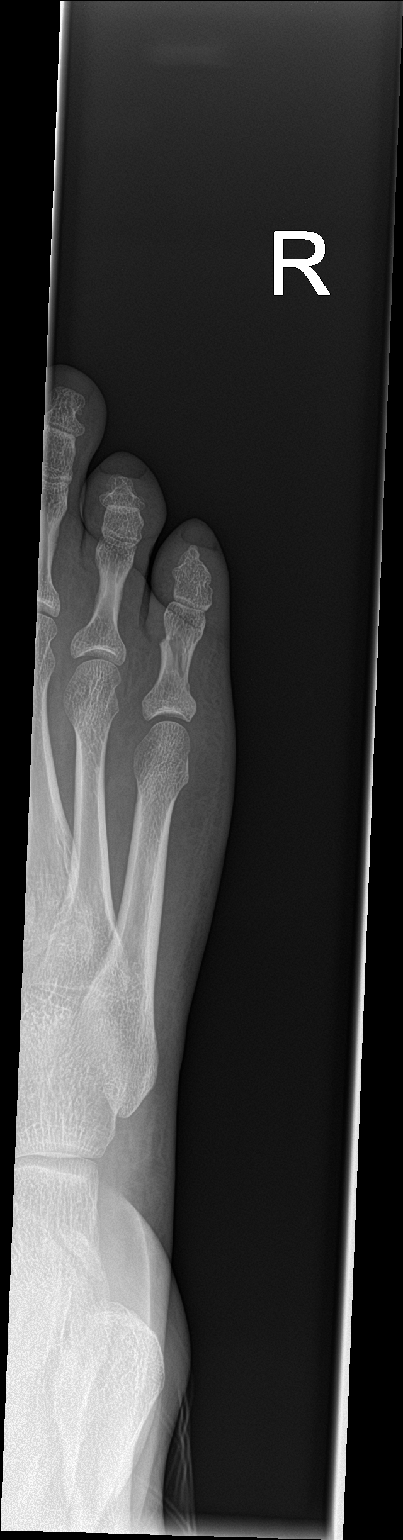

[toe obl]
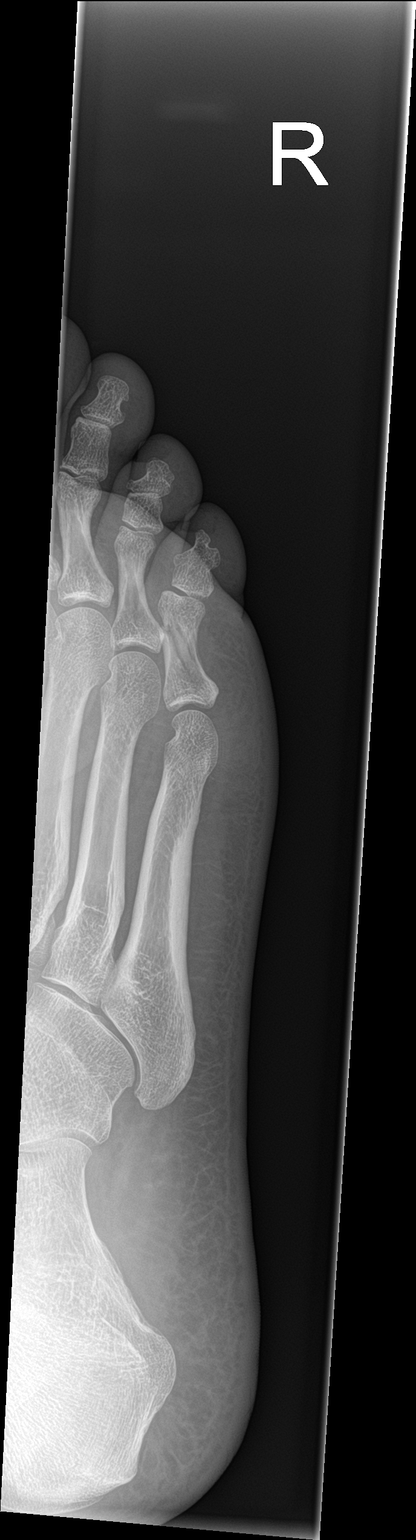

[toe lat]
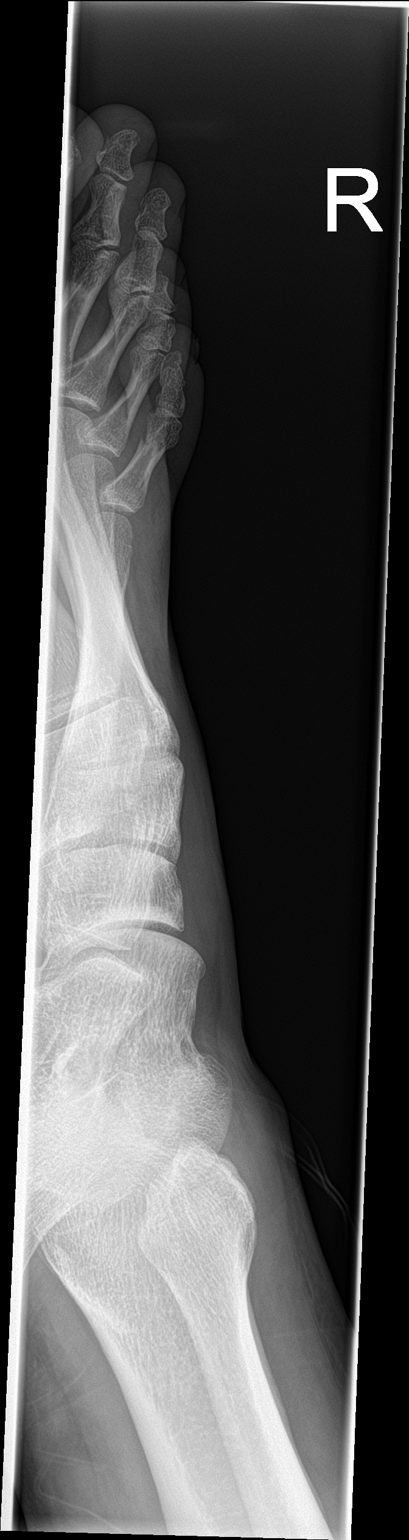

[3 of 3 positions shown; findings below may reference images not displayed]

FINDINGS: There is oblique essentially undisplaced fracture in the shaft of
proximal phalanx of right fifth toe. There is fusion of middle and
distal phalanges.
IMPRESSION: Essentially undisplaced fracture is seen in the shaft of proximal
phalanx of right fifth toe.

## 2023-04-05 ENCOUNTER — Telehealth: Payer: 59 | Admitting: Physician Assistant

## 2023-04-05 DIAGNOSIS — B001 Herpesviral vesicular dermatitis: Secondary | ICD-10-CM

## 2023-04-05 MED ORDER — VALACYCLOVIR HCL 1 G PO TABS: 2000.0000 mg | ORAL_TABLET | Freq: Two times a day (BID) | ORAL | 0 refills | Status: AC

## 2023-04-05 NOTE — Progress Notes (Signed)
We are sorry that you are not feeling well.  Here is how we plan to help!  Based on what you have shared with me it does look like you have a viral infection.    Most cold sores or fever blisters are small fluid filled blisters around the mouth caused by herpes simplex virus.  The most common strain of the virus causing cold sores is herpes simplex virus 1.  It can be spread by skin contact, sharing eating utensils, or even sharing towels.  Cold sores are contagious to other people until dry. (Approximately 5-7 days).  Wash your hands. You can spread the virus to your eyes through handling your contact lenses after touching the lesions.  Most people experience pain at the sight or tingling sensations in their lips that may begin before the ulcers erupt.  Herpes simplex is treatable but not curable.  It may lie dormant for a long time and then reappear due to stress or prolonged sun exposure.  Many patients have success in treating their cold sores with an over the counter topical called Abreva.  You may apply the cream up to 5 times daily (maximum 10 days) until healing occurs.  If you would like to use an oral antiviral medication to speed the healing of your cold sore, I have sent a prescription to your local pharmacy Valacyclovir 2 gm take one by mouth twice a day for 1 day    HOME CARE:  Wash your hands frequently. Do not pick at or rub the sore. Don't open the blisters. Avoid kissing other people during this time. Avoid sharing drinking glasses, eating utensils, or razors. Do not handle contact lenses unless you have thoroughly washed your hands with soap and warm water! Avoid oral sex during this time.  Herpes from sores on your mouth can spread to your partner's genital area. Avoid contact with anyone who has eczema or a weakened immune system. Cold sores are often triggered by exposure to intense sunlight, use a lip balm containing a sunscreen (SPF 30 or higher).  GET HELP RIGHT AWAY  IF:  Blisters look infected. Blisters occur near or in the eye. Symptoms last longer than 10 days. Your symptoms become worse.  MAKE SURE YOU:  Understand these instructions. Will watch your condition. Will get help right away if you are not doing well or get worse.    Your e-visit answers were reviewed by a board certified advanced clinical practitioner to complete your personal care plan.  Depending upon the condition, your plan could have  Included both over the counter or prescription medications.    Please review your pharmacy choice.  Be sure that the pharmacy you have chosen is open so that you can pick up your prescription now.  If there is a problem you can message your provider in [place] to have the prescription routed to another pharmacy.    Your safety is important to [place].  If you have drug allergies check our prescription carefully.  For the next 24 hours you can use MyChart to ask questions about today's visit, request a non-urgent call back, or ask for a work or school excuse from your e-visit provider.  You will get an email in the next two days asking about your experience.  I hope that your e-visit has been valuable and will speed your recovery.  I have spent 5 minutes in review of e-visit questionnaire, review and updating patient chart, medical decision making and response to patient.  Mar Daring, PA-C

## 2023-12-04 ENCOUNTER — Ambulatory Visit (HOSPITAL_COMMUNITY)
Admission: EM | Admit: 2023-12-04 | Discharge: 2023-12-04 | Disposition: A | Discharge status: Home / Self Care | Attending: Emergency Medicine | Admitting: Emergency Medicine

## 2023-12-04 ENCOUNTER — Ambulatory Visit (INDEPENDENT_AMBULATORY_CARE_PROVIDER_SITE_OTHER)

## 2023-12-04 ENCOUNTER — Encounter (HOSPITAL_COMMUNITY): Payer: Self-pay | Admitting: Emergency Medicine

## 2023-12-04 DIAGNOSIS — S91332A Puncture wound without foreign body, left foot, initial encounter: Secondary | ICD-10-CM

## 2023-12-04 DIAGNOSIS — S99922A Unspecified injury of left foot, initial encounter: Secondary | ICD-10-CM

## 2023-12-04 MED ORDER — IBUPROFEN 800 MG PO TABS: 800.0000 mg | ORAL_TABLET | Freq: Three times a day (TID) | ORAL | 0 refills | Status: AC

## 2023-12-04 MED ORDER — IBUPROFEN 800 MG PO TABS
800.0000 mg | ORAL_TABLET | Freq: Once | ORAL | Status: AC
  Administered 2023-12-04: 800 mg via ORAL

## 2023-12-04 MED ORDER — IBUPROFEN 800 MG PO TABS
ORAL_TABLET | ORAL | Status: AC
  Filled 2023-12-04: qty 1

## 2023-12-04 MED ORDER — CEPHALEXIN 500 MG PO CAPS: 500.0000 mg | ORAL_CAPSULE | Freq: Two times a day (BID) | ORAL | 0 refills | Status: AC

## 2023-12-04 NOTE — ED Triage Notes (Signed)
 Pt reports working in yard and stepped on old nail that was in old fence post that went through show into left foot. Patient has pain esp with weight bearing. Unknown last Tetanus.

## 2023-12-04 NOTE — ED Provider Notes (Signed)
 [facility]    CSN: [id number] Arrival date & time: 12/04/23  1527      History   Chief Complaint Chief Complaint  Patient presents with   Foot Injury    HPI Eric Mack is a 43 y.o. male.   Patient presents to clinic over concern of left foot injury. He stepped on a 3 inch nail earlier today while working a fence outside, nail went through his sneakers and punctured the bottom of his foot. Has not taken any medication for pain. Thinks Tdap updated in 2022.   Cleaned wound with dish soap and wrapped PTA.   The history is provided by the patient and medical records.  Foot Injury   Past Medical History:  Diagnosis Date   Medical history non-contributory     There are no active problems to display for this patient.   Past Surgical History:  Procedure Laterality Date   I & D EXTREMITY Right 05/03/2021   Procedure: IRRIGATION AND DEBRIDEMENT HAND;  Surgeon: Knute Neu, MD;  Location: [facility] OR;  Service: Plastics;  Laterality: Right;   NO PAST SURGERIES         Home Medications    Prior to Admission medications   Medication Sig Start Date End Date Taking? Authorizing Provider  cephALEXin (KEFLEX) 500 MG capsule Take 1 capsule (500 mg total) by mouth 2 (two) times daily for 7 days. 12/04/23 12/11/23 Yes Rinaldo Ratel, [place], FNP  ibuprofen (ADVIL) 800 MG tablet Take 1 tablet (800 mg total) by mouth 3 (three) times daily. 12/04/23  Yes Jaquesha Boroff, [place], FNP    Family History Family History  Problem Relation Age of Onset   Mental illness Father     Social History Social History   Tobacco Use   Smoking status: Never   Smokeless tobacco: Never  Substance Use Topics   Alcohol use: Yes    Comment: 2x week   Drug use: No     Allergies   Patient has no known allergies.   Review of Systems Review of Systems  Per HPI  Physical Exam Triage Vital Signs ED Triage Vitals  Encounter Vitals Group     BP 12/04/23 1619 115/80     Systolic BP  Percentile --      Diastolic BP Percentile --      Pulse Rate 12/04/23 1619 (!) 58     Resp 12/04/23 1619 17     Temp 12/04/23 1619 98.1 F (36.7 C)     Temp Source 12/04/23 1619 Oral     SpO2 12/04/23 1619 97 %     Weight --      Height --      Head Circumference --      Peak Flow --      Pain Score 12/04/23 1618 6     Pain Loc --      Pain Education --      Exclude from Growth Chart --    No data found.  Updated Vital Signs BP 115/80 (BP Location: Right Arm)   Pulse (!) 58   Temp 98.1 F (36.7 C) (Oral)   Resp 17   SpO2 97%   Visual Acuity Right Eye Distance:   Left Eye Distance:   Bilateral Distance:    Right Eye Near:   Left Eye Near:    Bilateral Near:     Physical Exam Vitals and nursing note reviewed.  Constitutional:      Appearance: Normal appearance.  HENT:  Head: Normocephalic and atraumatic.     Right Ear: External ear normal.     Left Ear: External ear normal.     Nose: Nose normal.     Mouth/Throat:     Mouth: Mucous membranes are moist.  Eyes:     Conjunctiva/sclera: Conjunctivae normal.  Cardiovascular:     Rate and Rhythm: Normal rate.     Pulses:          Dorsalis pedis pulses are 2+ on the left side.       Posterior tibial pulses are 2+ on the left side.  Pulmonary:     Effort: Pulmonary effort is normal. No respiratory distress.  Musculoskeletal:       Feet:  Skin:    General: Skin is warm and dry.     Capillary Refill: Capillary refill takes less than 2 seconds.  Neurological:     General: No focal deficit present.     Mental Status: He is alert.  Psychiatric:        Mood and Affect: Mood normal.        Behavior: Behavior is cooperative.      UC Treatments / Results  Labs (all labs ordered are listed, but only abnormal results are displayed) Labs Reviewed - No data to display  EKG   Radiology DG Foot Complete Left Result Date: 12/04/2023 CLINICAL DATA:  injury to foot with nail EXAM: LEFT FOOT - COMPLETE 3+ VIEW  COMPARISON:  None Available. FINDINGS: No acute fracture or dislocation. Joint spaces and alignment are maintained. No area of erosion or osseous destruction. No unexpected radiopaque foreign body. Soft tissues are unremarkable. IMPRESSION: 1. No acute fracture or dislocation. 2. No unexpected radiopaque foreign body. Electronically Signed   By: Meda Klinefelter M.D.   On: 12/04/2023 17:04    Procedures Procedures (including critical care time)  Medications Ordered in UC Medications  ibuprofen (ADVIL) tablet 800 mg (800 mg Oral Given 12/04/23 1651)    Initial Impression / Assessment and Plan / UC Course  I have reviewed the triage vital signs and the nursing notes.  Pertinent labs & imaging results that were available during my care of the patient were reviewed by me and considered in my medical decision making (see chart for details).  Vitals and triage reviewed, patient is hemodynamically stable.  Small puncture wound to the sole of hindfoot.  Imaging obtained does not show any retained foreign body, per my interpretation.  Radiology did not see fractures or retained foreign body as well.  Tdap by medical records updated in 2021.  Placed on Keflex to help prevent bacterial infection.  Wound care provided in clinic.    Plan of care, follow-up care return precautions given, no questions at this time.     Final Clinical Impressions(s) / UC Diagnoses   Final diagnoses:  Injury of left foot, initial encounter  Puncture wound of left foot, initial encounter     Discharge Instructions      Your imaging did not show any retained radiopaque foreign body.  Start the Keflex and take this twice daily for the next 7 days with food to prevent gastrointestinal upset.  For any pain you can take 800 mg of ibuprofen every 8 hours.  Keep your wound clean and dry.  Follow-up with your primary care provider or return to clinic for any pain, swelling, warmth or streaking of the foot, as this may  indicate signs of infection.     ED Prescriptions  Medication Sig Dispense Auth. Provider   cephALEXin (KEFLEX) 500 MG capsule Take 1 capsule (500 mg total) by mouth 2 (two) times daily for 7 days. 14 capsule Rinaldo Ratel, [place], [place]   ibuprofen (ADVIL) 800 MG tablet Take 1 tablet (800 mg total) by mouth 3 (three) times daily. 21 tablet Kerin Cecchi, [place], [place]      PDMP not reviewed this encounter.   Filemon Breton, [place], [place] 12/04/23 1710

## 2023-12-04 NOTE — Discharge Instructions (Addendum)
 Your imaging did not show any retained radiopaque foreign body.  Start the Keflex and take this twice daily for the next 7 days with food to prevent gastrointestinal upset.  For any pain you can take 800 mg of ibuprofen every 8 hours.  Keep your wound clean and dry.  Follow-up with your primary care provider or return to clinic for any pain, swelling, warmth or streaking of the foot, as this may indicate signs of infection.

## 2024-03-21 ENCOUNTER — Encounter: Payer: Self-pay | Admitting: Adult Health

## 2024-03-21 ENCOUNTER — Ambulatory Visit (INDEPENDENT_AMBULATORY_CARE_PROVIDER_SITE_OTHER): Admitting: Adult Health

## 2024-03-21 ENCOUNTER — Ambulatory Visit

## 2024-03-21 VITALS — BP 130/88 | HR 70 | Temp 98.0°F | Ht 71.0 in | Wt 220.0 lb

## 2024-03-21 DIAGNOSIS — M5412 Radiculopathy, cervical region: Secondary | ICD-10-CM | POA: Diagnosis not present

## 2024-03-21 MED ORDER — CYCLOBENZAPRINE HCL 10 MG PO TABS: 10.0000 mg | ORAL_TABLET | Freq: Every day | ORAL | 0 refills | Status: AC

## 2024-03-21 MED ORDER — PREDNISONE 50 MG PO TABS: 50.0000 mg | ORAL_TABLET | Freq: Every day | ORAL | 0 refills | Status: AC

## 2024-03-21 NOTE — Progress Notes (Signed)
 Subjective:    Patient ID: Eric Mack, male    DOB: 1981-03-19, 43 y.o.   MRN: [id number]  HPI 43 year old male who  has a past medical history of Medical history non-contributory.  He presents to the office today for an acute issue.  He reports that starting roughly 2 months ago he developed numbness and tingling that radiates from the right side of his neck down his right arm to his fingertips.  Believes this started around mid to end of May 2025 and has been ongoing.  Believes that he was working out in the yard when this started.  When his initial symptoms were present the pain was more severe and almost felt like I was getting tased on the right side of my chest.  Certain movements with his neck exacerbate the numbness and tingling.  He feels as though over the last few weeks numbness and tingling has improved but is still present. Numbness and tingling is in all fingers but less in his thumb.   He does have some decreased grip strength in the right side and has noticed that he is dropping things more often.  About a month he got massages but had little relief with this.   Review of Systems See HPI   Past Medical History:  Diagnosis Date   Medical history non-contributory     Social History   Socioeconomic History   Marital status: Married    Spouse name: Not on file   Number of children: Not on file   Years of education: Not on file   Highest education level: Not on file  Occupational History   Not on file  Tobacco Use   Smoking status: Never   Smokeless tobacco: Never  Substance and Sexual Activity   Alcohol use: Yes    Comment: 2x week   Drug use: No   Sexual activity: Not on file  Other Topics Concern   Not on file  Social History Narrative   Not on file   Social Drivers of Health   Financial Resource Strain: Not on file  Food Insecurity: Not on file  Transportation Needs: Not on file  Physical Activity: Not on file  Stress: Not on file  Social  Connections: Not on file  Intimate Partner Violence: Not on file    Past Surgical History:  Procedure Laterality Date   I & D EXTREMITY Right 05/03/2021   Procedure: IRRIGATION AND DEBRIDEMENT HAND;  Surgeon: Lorretta Dess, MD;  Location: [facility];  Service: Plastics;  Laterality: Right;   NO PAST SURGERIES      Family History  Problem Relation Age of Onset   Mental illness Father     No Known Allergies  Current Outpatient Medications on File Prior to Visit  Medication Sig Dispense Refill   ibuprofen  (ADVIL ) 800 MG tablet Take 1 tablet (800 mg total) by mouth 3 (three) times daily. 21 tablet 0   No current facility-administered medications on file prior to visit.    BP 130/88   Pulse 70   Temp 98 F (36.7 C) (Oral)   Ht 5' 11 (1.803 m)   Wt 220 lb (99.8 kg)   SpO2 97%   BMI 30.68 kg/m       Objective:   Physical Exam Vitals and nursing note reviewed.  Constitutional:      Appearance: Normal appearance.  Cardiovascular:     Rate and Rhythm: Normal rate and regular rhythm.     Pulses:  Normal pulses.     Heart sounds: Normal heart sounds.  Pulmonary:     Effort: Pulmonary effort is normal.     Breath sounds: Normal breath sounds.  Musculoskeletal:        General: Tenderness present.     Cervical back: No torticollis, tenderness or bony tenderness. Pain with movement present. Normal range of motion.       Back:     Comments: Positive Spurling's test on right side   Skin:    General: Skin is warm and dry.     Capillary Refill: Capillary refill takes less than 2 seconds.  Neurological:     Mental Status: He is alert and oriented to person, place, and time.     Sensory: No sensory deficit.     Motor: Weakness present.     Comments: 3-4/5 RUE grip strength  5/5 LUE grip strngth         Assessment & Plan:  1. Cervical radiculopathy (Primary) - Exam consistent with cervical radiculopathy, likely at C6-C7. Will trial him on Flexeril  and Prednisone . Consider PT  and if not improving then MRI  - DG Cervical Spine Complete; Future - predniSONE  (DELTASONE ) 50 MG tablet; Take 1 tablet (50 mg total) by mouth daily with breakfast.  Dispense: 5 tablet; Refill: 0 - cyclobenzaprine  (FLEXERIL ) 10 MG tablet; Take 1 tablet (10 mg total) by mouth at bedtime.  Dispense: 15 tablet; Refill: 0  Darleene Shape, NP

## 2024-03-28 ENCOUNTER — Ambulatory Visit: Payer: Self-pay | Admitting: Adult Health

## 2024-04-05 NOTE — Telephone Encounter (Signed)
 Letter sent to my chart

## 2024-04-06 ENCOUNTER — Encounter: Payer: Self-pay | Admitting: Adult Health

## 2024-04-11 ENCOUNTER — Telehealth: Payer: Self-pay

## 2024-04-11 NOTE — Telephone Encounter (Signed)
 Will call pt back. Please follow up via result notes.

## 2024-04-11 NOTE — Telephone Encounter (Signed)
 Copied from CRM #[id number]. Topic: Clinical - Lab/Test Results >> Apr 10, 2024 10:35 AM Macario HERO wrote: Reason for CRM: Patient called regarding lab results voicemail from [place]. Called CAL and was advised she is out of the office and to send a crm.

## 2024-04-11 NOTE — Telephone Encounter (Unsigned)
 Copied from CRM [id number]. Topic: Clinical - Lab/Test Results >> Apr 07, 2024  5:05 PM Drema MATSU wrote: Reason for CRM: Patient is returning call from [facility] in regards to imaging results.
